# Patient Record
Sex: Female | Born: 1960 | Race: White | Hispanic: No | Marital: Married | Smoking: Never smoker
Health system: Southern US, Community
[De-identification: ages and names within clinical notes are randomized; demographics above are authoritative.]

## PROBLEM LIST (undated history)

## (undated) HISTORY — PX: ABLATION: SHX5711

---

## 2002-05-14 HISTORY — PX: AUGMENTATION MAMMAPLASTY: SUR837

## 2011-12-07 DIAGNOSIS — K635 Polyp of colon: Secondary | ICD-10-CM | POA: Insufficient documentation

## 2011-12-07 DIAGNOSIS — G4700 Insomnia, unspecified: Secondary | ICD-10-CM | POA: Insufficient documentation

## 2015-02-09 DIAGNOSIS — N200 Calculus of kidney: Secondary | ICD-10-CM | POA: Insufficient documentation

## 2015-02-09 DIAGNOSIS — Z87898 Personal history of other specified conditions: Secondary | ICD-10-CM | POA: Insufficient documentation

## 2015-02-09 DIAGNOSIS — Z8619 Personal history of other infectious and parasitic diseases: Secondary | ICD-10-CM | POA: Insufficient documentation

## 2015-02-09 DIAGNOSIS — I493 Ventricular premature depolarization: Secondary | ICD-10-CM | POA: Insufficient documentation

## 2015-02-09 DIAGNOSIS — H6993 Unspecified Eustachian tube disorder, bilateral: Secondary | ICD-10-CM | POA: Insufficient documentation

## 2015-05-03 DIAGNOSIS — K5904 Chronic idiopathic constipation: Secondary | ICD-10-CM | POA: Insufficient documentation

## 2015-11-16 DIAGNOSIS — E559 Vitamin D deficiency, unspecified: Secondary | ICD-10-CM | POA: Insufficient documentation

## 2019-03-31 DIAGNOSIS — Z87440 Personal history of urinary (tract) infections: Secondary | ICD-10-CM | POA: Insufficient documentation

## 2019-04-03 ENCOUNTER — Other Ambulatory Visit: Payer: Self-pay

## 2019-04-03 ENCOUNTER — Ambulatory Visit
Admission: EM | Admit: 2019-04-03 | Discharge: 2019-04-03 | Disposition: A | Discharge status: Home / Self Care | Payer: BC Managed Care – PPO | Attending: Emergency Medicine | Admitting: Emergency Medicine

## 2019-04-03 DIAGNOSIS — R5383 Other fatigue: Secondary | ICD-10-CM

## 2019-04-03 DIAGNOSIS — J302 Other seasonal allergic rhinitis: Secondary | ICD-10-CM

## 2019-04-03 LAB — SARS CORONAVIRUS 2 AG (30 MIN TAT): SARS Coronavirus 2 Ag: NEGATIVE

## 2019-04-03 NOTE — ED Provider Notes (Signed)
[facility]    CSN: [id number] Arrival date & time: 04/03/19  1435      History   Chief Complaint Chief Complaint  Patient presents with  . Fatigue    HPI Audrey Robertson is a 58 y.o. female.   HPI  58 year old female presents with sinus pressure and pain in the maxillary area with fatigue that she has had for 1 day.  States that she does not feel well overall.  She has had no fever or chills.  He denies any cough or shortness of breath.  History of seasonal allergies for which she uses Flonase and Zyrtec.  Main concern is that she is caring for 35 year old mother and wants to protect her.        History reviewed. No pertinent past medical history.  There are no active problems to display for this patient.   Past Surgical History:  Procedure Laterality Date  . ABLATION      OB History   No obstetric history on file.      Home Medications    Prior to Admission medications   Medication Sig Start Date End Date Taking? Authorizing Provider  buPROPion (WELLBUTRIN XL) 150 MG 24 hr tablet Take 150 mg by mouth daily. 03/02/19  Yes [provider]  estradiol (ESTRACE) 0.1 MG/GM vaginal cream  12/16/18  Yes [provider]  fluticasone (FLONASE) 50 MCG/ACT nasal spray Place into both nostrils daily.   Yes [provider]  omeprazole (PRILOSEC) 40 MG capsule Take 40 mg by mouth daily. 03/31/19  Yes [provider]  trimethoprim (TRIMPEX) 100 MG tablet Take 100 mg by mouth daily as needed. 03/11/19  Yes [provider]    Family History History reviewed. No pertinent family history.  Social History Social History   Tobacco Use  . Smoking status: Never Smoker  . Smokeless tobacco: Never Used  Substance Use Topics  . Alcohol use: Yes    Comment: wine occasionally  . Drug use: Not Currently     Allergies   Doxycycline and Macrodantin [nitrofurantoin macrocrystal]   Review of Systems Review of  Systems  Constitutional: Positive for activity change and fatigue. Negative for appetite change, chills and fever.  HENT: Positive for congestion, sinus pressure and sinus pain.   Respiratory: Negative for cough.   All other systems reviewed and are negative.    Physical Exam Triage Vital Signs ED Triage Vitals  Enc Vitals Group     BP 04/03/19 1508 127/73     Pulse Rate 04/03/19 1508 77     Resp 04/03/19 1508 16     Temp 04/03/19 1508 97.9 F (36.6 C)     Temp Source 04/03/19 1508 Oral     SpO2 04/03/19 1508 100 %     Weight 04/03/19 1503 134 lb (60.8 kg)     Height 04/03/19 1503 5\' 5"  (1.651 m)     Head Circumference --      Peak Flow --      Pain Score 04/03/19 1503 0     Pain Loc --      Pain Edu? --      Excl. in [place]? --    No data found.  Updated Vital Signs BP 127/73 (BP Location: Right Arm)   Pulse 77   Temp 97.9 F (36.6 C) (Oral)   Resp 16   Ht 5\' 5"  (1.651 m)   Wt 134 lb (60.8 kg)   SpO2 100%   BMI 22.30  kg/m   Visual Acuity Right Eye Distance:   Left Eye Distance:   Bilateral Distance:    Right Eye Near:   Left Eye Near:    Bilateral Near:     Physical Exam Vitals signs and nursing note reviewed.  Constitutional:      General: She is not in acute distress.    Appearance: Normal appearance. She is normal weight. She is not ill-appearing, toxic-appearing or diaphoretic.  HENT:     Head: Normocephalic and atraumatic.     Right Ear: Tympanic membrane, ear canal and external ear normal.     Left Ear: Tympanic membrane, ear canal and external ear normal.     Nose: Nose normal.     Mouth/Throat:     Mouth: Mucous membranes are moist.     Pharynx: Oropharynx is clear. No oropharyngeal exudate or posterior oropharyngeal erythema.  Eyes:     Conjunctiva/sclera: Conjunctivae normal.  Neck:     Musculoskeletal: Normal range of motion and neck supple.  Cardiovascular:     Rate and Rhythm: Normal rate and regular rhythm.     Pulses: Normal pulses.      Heart sounds: Normal heart sounds.  Pulmonary:     Breath sounds: Normal breath sounds.  Musculoskeletal: Normal range of motion.  Skin:    General: Skin is warm and dry.  Neurological:     General: No focal deficit present.     Mental Status: She is alert and oriented to person, place, and time.  Psychiatric:        Mood and Affect: Mood normal.        Behavior: Behavior normal.        Thought Content: Thought content normal.        Judgment: Judgment normal.      UC Treatments / Results  Labs (all labs ordered are listed, but only abnormal results are displayed) Labs Reviewed  SARS CORONAVIRUS 2 AG (30 MIN TAT)    EKG   Radiology No results found.  Procedures Procedures (including critical care time)  Medications Ordered in UC Medications - No data to display  Initial Impression / Assessment and Plan / UC Course  I have reviewed the triage vital signs and the nursing notes.  Pertinent labs & imaging results that were available during my care of the patient were reviewed by me and considered in my medical decision making (see chart for details).   58 year old female presents with sinus pressure and pain and fatigue that she has had for 1 day.  Antigen Covid testing was negative today.  Because of her symptomatic presentation we will also test PCR.  She will quarantine until the results of the Covid testing are available.  She will continue using her Flonase and Zyrtec.   Final Clinical Impressions(s) / UC Diagnoses   Final diagnoses:  Fatigue, unspecified type  Seasonal allergies     Discharge Instructions     Quarantine until after the results of the Covid testing are available    ED Prescriptions    None     PDMP not reviewed this encounter.   Lutricia Feil, PA-C 04/03/19 1601

## 2019-04-03 NOTE — Discharge Instructions (Signed)
Quarantine until after the results of the Covid testing are available

## 2019-04-03 NOTE — ED Triage Notes (Addendum)
Patient states that she has been having sinus pressure and pain along with fatigue x yesterday. Patient states that she overall just doesn't feel well and would like to be swabbed.

## 2019-04-04 LAB — NOVEL CORONAVIRUS, NAA (HOSP ORDER, SEND-OUT TO REF LAB; TAT 18-24 HRS): SARS-CoV-2, NAA: NOT DETECTED

## 2019-05-16 DIAGNOSIS — Z8616 Personal history of COVID-19: Secondary | ICD-10-CM | POA: Insufficient documentation

## 2019-05-17 ENCOUNTER — Other Ambulatory Visit: Payer: Self-pay

## 2019-05-17 ENCOUNTER — Ambulatory Visit
Admission: EM | Admit: 2019-05-17 | Discharge: 2019-05-17 | Disposition: A | Discharge status: Home / Self Care | Payer: BC Managed Care – PPO | Attending: Family Medicine | Admitting: Family Medicine

## 2019-05-17 ENCOUNTER — Encounter: Payer: Self-pay | Admitting: Emergency Medicine

## 2019-05-17 DIAGNOSIS — R05 Cough: Secondary | ICD-10-CM | POA: Insufficient documentation

## 2019-05-17 DIAGNOSIS — Z20822 Contact with and (suspected) exposure to covid-19: Secondary | ICD-10-CM | POA: Insufficient documentation

## 2019-05-17 DIAGNOSIS — B9789 Other viral agents as the cause of diseases classified elsewhere: Secondary | ICD-10-CM | POA: Insufficient documentation

## 2019-05-17 DIAGNOSIS — R059 Cough, unspecified: Secondary | ICD-10-CM

## 2019-05-17 DIAGNOSIS — Z880 Allergy status to penicillin: Secondary | ICD-10-CM | POA: Insufficient documentation

## 2019-05-17 DIAGNOSIS — J988 Other specified respiratory disorders: Secondary | ICD-10-CM

## 2019-05-17 DIAGNOSIS — Z881 Allergy status to other antibiotic agents status: Secondary | ICD-10-CM | POA: Diagnosis not present

## 2019-05-17 DIAGNOSIS — Z79899 Other long term (current) drug therapy: Secondary | ICD-10-CM | POA: Insufficient documentation

## 2019-05-17 LAB — INFLUENZA PANEL BY PCR (TYPE A & B)
Influenza A By PCR: NEGATIVE
Influenza B By PCR: NEGATIVE

## 2019-05-17 NOTE — ED Triage Notes (Signed)
Patient c/o cough, chest congestion and bodyaches that started 2 days ago.  Patient denies fevers.

## 2019-05-17 NOTE — Discharge Instructions (Addendum)
Recommend continue Tylenol as needed for body aches. May continue Mucinex DM as needed for cough. Continue to push fluids to help loosen up mucus in chest. Rest. Stay at home. Follow-up pending COVID 19 test results.

## 2019-05-18 NOTE — ED Provider Notes (Signed)
[facility]    CSN: [id number] Arrival date & time: 05/17/19  1306      History   Chief Complaint Chief Complaint  Patient presents with  . Cough  . Generalized Body Aches    HPI Audrey Robertson is a 59 y.o. female.   59 year old female presents with body aches, cough and chest congestion that started yesterday. Minimal nasal congestion. Denies any fever or GI symptoms. Husband also sick with similar symptoms. Has taken Mucinex DM, Tylenol and Zicam with some relief. Concerned over possibility of influenza. Other chronic health issues include GERD, recurrent UTI's and environmental allergies and currently takes Wellbutrin, Prilosec, Vit D and Flonase daily. Uses Estrace vaginal cream and Trimethoprim prn.   The history is provided by the patient.    History reviewed. No pertinent past medical history.  There are no problems to display for this patient.   Past Surgical History:  Procedure Laterality Date  . ABLATION      OB History   No obstetric history on file.      Home Medications    Prior to Admission medications   Medication Sig Start Date End Date Taking? Authorizing Provider  buPROPion (WELLBUTRIN XL) 150 MG 24 hr tablet Take 150 mg by mouth daily. 03/02/19  Yes [provider]  estradiol (ESTRACE) 0.1 MG/GM vaginal cream  12/16/18  Yes [provider]  fluticasone (FLONASE) 50 MCG/ACT nasal spray Place into both nostrils daily.   Yes [provider]  omeprazole (PRILOSEC) 40 MG capsule Take 40 mg by mouth daily. 03/31/19  Yes [provider]  trimethoprim (TRIMPEX) 100 MG tablet Take 100 mg by mouth daily as needed. 03/11/19  Yes [provider]    Family History History reviewed. No pertinent family history.  Social History Social History   Tobacco Use  . Smoking status: Never Smoker  . Smokeless tobacco: Never Used  Substance Use Topics  . Alcohol use: Yes    Comment: wine occasionally    . Drug use: Not Currently     Allergies   Erythromycin, Doxycycline, Macrodantin [nitrofurantoin macrocrystal], Penicillins, and Amoxicillin-pot clavulanate   Review of Systems Review of Systems  Constitutional: Positive for fatigue. Negative for activity change, appetite change, chills and fever.  HENT: Positive for congestion (chest), postnasal drip and rhinorrhea. Negative for ear discharge, ear pain, facial swelling, mouth sores, nosebleeds, sinus pressure, sinus pain, sneezing, sore throat and trouble swallowing.   Eyes: Negative for pain, discharge, redness and itching.  Respiratory: Positive for cough. Negative for chest tightness, shortness of breath and wheezing.   Gastrointestinal: Negative for abdominal pain, diarrhea, nausea and vomiting.  Musculoskeletal: Positive for arthralgias and myalgias. Negative for back pain, neck pain and neck stiffness.  Skin: Negative for color change, rash and wound.  Allergic/Immunologic: Positive for environmental allergies. Negative for immunocompromised state.  Neurological: Negative for dizziness, tremors, seizures, syncope, weakness, light-headedness, numbness and headaches.  Hematological: Negative for adenopathy. Does not bruise/bleed easily.     Physical Exam Triage Vital Signs ED Triage Vitals  Enc Vitals Group     BP 05/17/19 1343 138/73     Pulse Rate 05/17/19 1343 65     Resp 05/17/19 1343 14     Temp 05/17/19 1343 97.8 F (36.6 C)     Temp Source 05/17/19 1343 Oral     SpO2 05/17/19 1343 99 %     Weight 05/17/19 1339 134 lb (60.8 kg)     Height 05/17/19  1339 5\' 5"  (1.651 m)     Head Circumference --      Peak Flow --      Pain Score 05/17/19 1339 0     Pain Loc --      Pain Edu? --      Excl. in [place]? --    No data found.  Updated Vital Signs BP 138/73 (BP Location: Left Arm)   Pulse 65   Temp 97.8 F (36.6 C) (Oral)   Resp 14   Ht 5\' 5"  (1.651 m)   Wt 134 lb (60.8 kg)   SpO2 99%   BMI 22.30 kg/m   Visual  Acuity Right Eye Distance:   Left Eye Distance:   Bilateral Distance:    Right Eye Near:   Left Eye Near:    Bilateral Near:     Physical Exam Vitals and nursing note reviewed.  Constitutional:      General: She is awake. She is not in acute distress.    Appearance: She is well-developed and well-groomed.     Comments: Patient sitting comfortably on exam table in no acute distress.   HENT:     Head: Normocephalic and atraumatic.     Right Ear: Hearing, tympanic membrane, ear canal and external ear normal.     Left Ear: Hearing, tympanic membrane, ear canal and external ear normal.     Nose: Rhinorrhea present. No mucosal edema. Rhinorrhea is clear.     Right Sinus: No maxillary sinus tenderness or frontal sinus tenderness.     Left Sinus: No maxillary sinus tenderness or frontal sinus tenderness.     Mouth/Throat:     Lips: Pink.     Mouth: Mucous membranes are moist.     Pharynx: Uvula midline. Posterior oropharyngeal erythema present. No pharyngeal swelling, oropharyngeal exudate or uvula swelling.  Eyes:     Extraocular Movements: Extraocular movements intact.     Conjunctiva/sclera: Conjunctivae normal.  Cardiovascular:     Rate and Rhythm: Normal rate and regular rhythm.     Heart sounds: Normal heart sounds. No murmur.  Pulmonary:     Effort: Pulmonary effort is normal. No respiratory distress.     Breath sounds: Normal breath sounds and air entry. No decreased air movement. No decreased breath sounds, wheezing, rhonchi or rales.  Musculoskeletal:        General: Normal range of motion.     Cervical back: Normal range of motion and neck supple. No rigidity.  Lymphadenopathy:     Cervical: No cervical adenopathy.  Skin:    General: Skin is warm and dry.     Capillary Refill: Capillary refill takes less than 2 seconds.     Findings: No rash.  Neurological:     General: No focal deficit present.     Mental Status: She is alert and oriented to person, place, and time.    Psychiatric:        Mood and Affect: Mood normal.        Behavior: Behavior normal. Behavior is cooperative.        Thought Content: Thought content normal.        Judgment: Judgment normal.      UC Treatments / Results  Labs (all labs ordered are listed, but only abnormal results are displayed) Labs Reviewed  NOVEL CORONAVIRUS, NAA (HOSP ORDER, SEND-OUT TO REF LAB; TAT 18-24 HRS)  INFLUENZA PANEL BY PCR (TYPE A & B)    EKG   Radiology No results found.  Procedures Procedures (including critical care time)  Medications Ordered in UC Medications - No data to display  Initial Impression / Assessment and Plan / UC Course  I have reviewed the triage vital signs and the nursing notes.  Pertinent labs & imaging results that were available during my care of the patient were reviewed by me and considered in my medical decision making (see chart for details).    Reviewed negative influenza test results. Discussed that she probably has a mild viral illness- can not rule out COVID 19. Recommend continue Mucinex DM as directed. May continue Tylenol as needed for body aches. Increase fluids to help loosen up any mucus in her chest. Rest. Stay at home. Follow-up pending COVID 19 test results.   Final Clinical Impressions(s) / UC Diagnoses   Final diagnoses:  Viral respiratory illness  Cough     Discharge Instructions     Recommend continue Tylenol as needed for body aches. May continue Mucinex DM as needed for cough. Continue to push fluids to help loosen up mucus in chest. Rest. Stay at home. Follow-up pending COVID 19 test results.     ED Prescriptions    None     PDMP not reviewed this encounter.   Katy Apo, NP 05/18/19 1005

## 2019-05-19 LAB — NOVEL CORONAVIRUS, NAA (HOSP ORDER, SEND-OUT TO REF LAB; TAT 18-24 HRS): SARS-CoV-2, NAA: NOT DETECTED

## 2019-12-11 DIAGNOSIS — R438 Other disturbances of smell and taste: Secondary | ICD-10-CM | POA: Insufficient documentation

## 2020-02-29 ENCOUNTER — Other Ambulatory Visit: Payer: Self-pay | Admitting: Pediatrics

## 2020-02-29 DIAGNOSIS — Z1231 Encounter for screening mammogram for malignant neoplasm of breast: Secondary | ICD-10-CM

## 2020-03-07 ENCOUNTER — Other Ambulatory Visit: Payer: Self-pay

## 2020-03-07 ENCOUNTER — Other Ambulatory Visit: Payer: Self-pay | Admitting: Pediatrics

## 2020-03-07 ENCOUNTER — Ambulatory Visit
Admission: RE | Admit: 2020-03-07 | Discharge: 2020-03-07 | Disposition: A | Discharge status: Home / Self Care | Payer: BC Managed Care – PPO | Source: Ambulatory Visit | Attending: Pediatrics | Admitting: Pediatrics

## 2020-03-07 DIAGNOSIS — Z1231 Encounter for screening mammogram for malignant neoplasm of breast: Secondary | ICD-10-CM

## 2020-03-09 ENCOUNTER — Inpatient Hospital Stay
Admission: RE | Admit: 2020-03-09 | Discharge: 2020-03-09 | Disposition: A | Discharge status: Home / Self Care | Payer: Self-pay | Source: Ambulatory Visit | Attending: *Deleted | Admitting: *Deleted

## 2020-03-09 ENCOUNTER — Other Ambulatory Visit: Payer: Self-pay | Admitting: *Deleted

## 2020-03-09 DIAGNOSIS — Z1231 Encounter for screening mammogram for malignant neoplasm of breast: Secondary | ICD-10-CM

## 2020-03-11 ENCOUNTER — Other Ambulatory Visit: Payer: Self-pay | Admitting: Pediatrics

## 2020-03-11 DIAGNOSIS — N6489 Other specified disorders of breast: Secondary | ICD-10-CM

## 2020-03-11 DIAGNOSIS — R928 Other abnormal and inconclusive findings on diagnostic imaging of breast: Secondary | ICD-10-CM

## 2020-03-25 ENCOUNTER — Ambulatory Visit
Admission: RE | Admit: 2020-03-25 | Discharge: 2020-03-25 | Disposition: A | Discharge status: Home / Self Care | Payer: BC Managed Care – PPO | Source: Ambulatory Visit | Attending: Pediatrics | Admitting: Pediatrics

## 2020-03-25 ENCOUNTER — Other Ambulatory Visit: Payer: Self-pay

## 2020-03-25 DIAGNOSIS — R928 Other abnormal and inconclusive findings on diagnostic imaging of breast: Secondary | ICD-10-CM | POA: Diagnosis not present

## 2020-03-25 DIAGNOSIS — N6489 Other specified disorders of breast: Secondary | ICD-10-CM

## 2020-11-11 DIAGNOSIS — M65311 Trigger thumb, right thumb: Secondary | ICD-10-CM | POA: Insufficient documentation

## 2021-02-21 DIAGNOSIS — Z8669 Personal history of other diseases of the nervous system and sense organs: Secondary | ICD-10-CM | POA: Insufficient documentation

## 2021-05-07 IMAGING — MG DIGITAL SCREENING BREAST BILAT IMPLANT W/ TOMO W/ CAD
9 of 16 series · 9 of 32 positions shown · non-contrast
Comparison: Previous exam(s).

CLINICAL DATA: Screening.

EXAM:
DIGITAL SCREENING BILATERAL MAMMOGRAM WITH IMPLANTS, CAD AND TOMO
The patient has prepectoral silicone implants. Standard and implant
displaced views were performed.

[R MLO (1 of 2)]
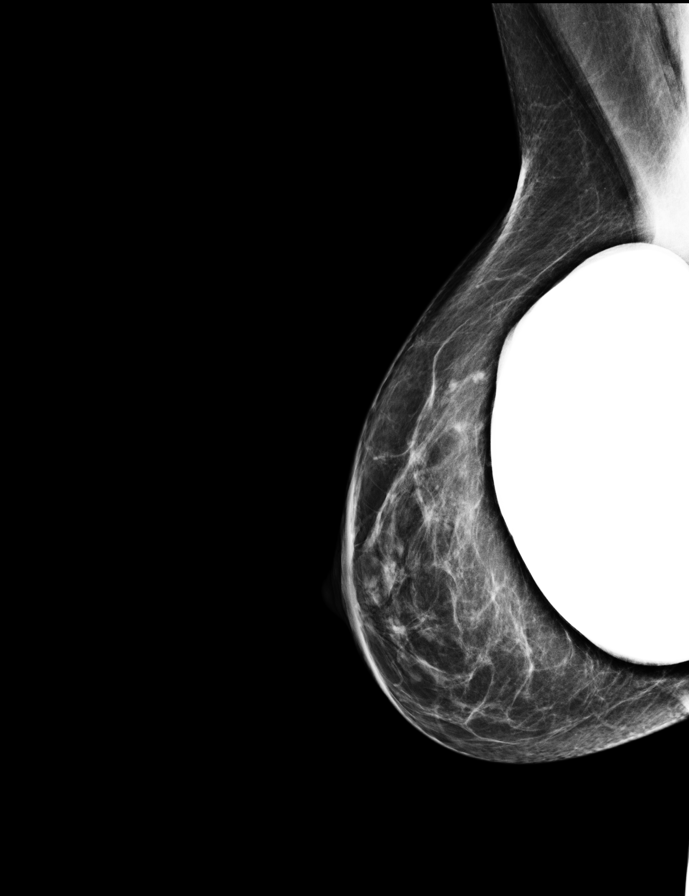

[L MLO (1 of 2)]
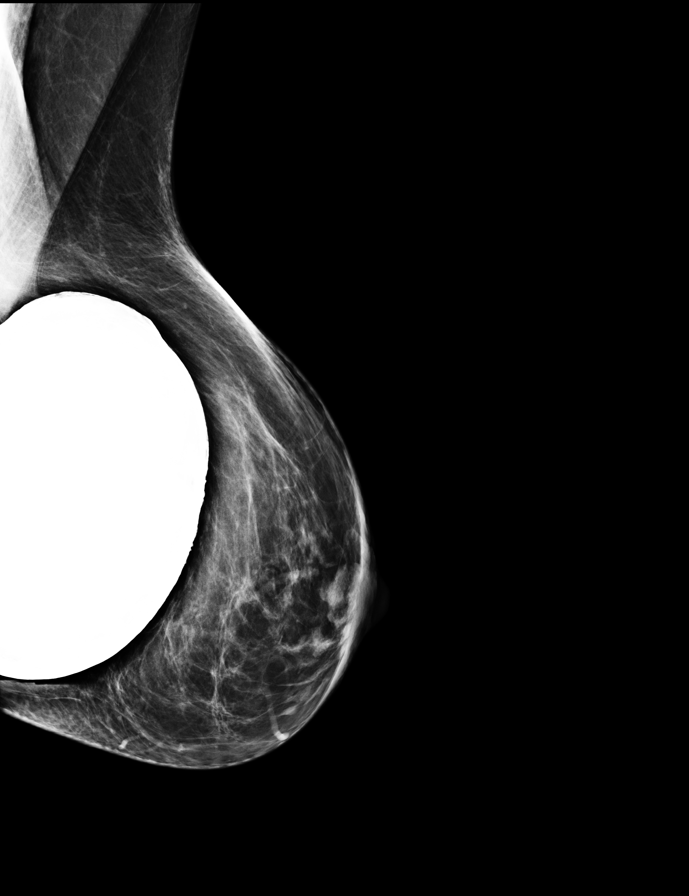

[L CC]
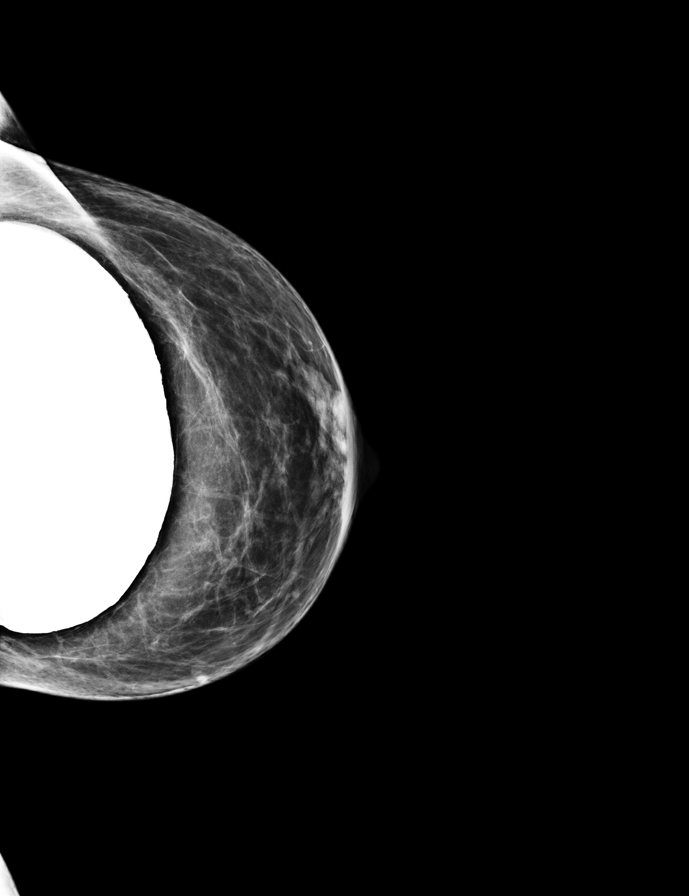

[R CC]
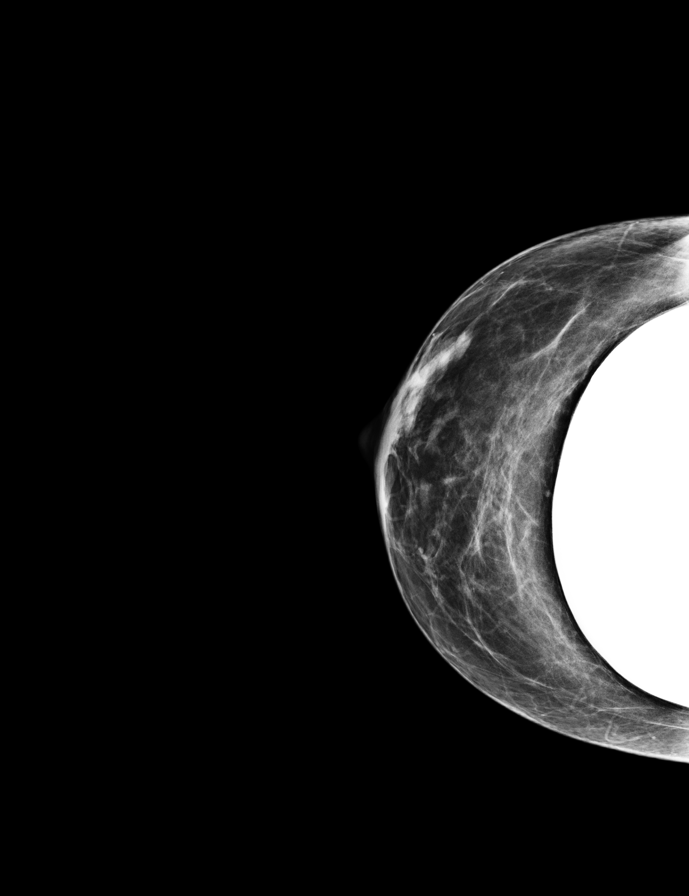

[R CC synth-2D]
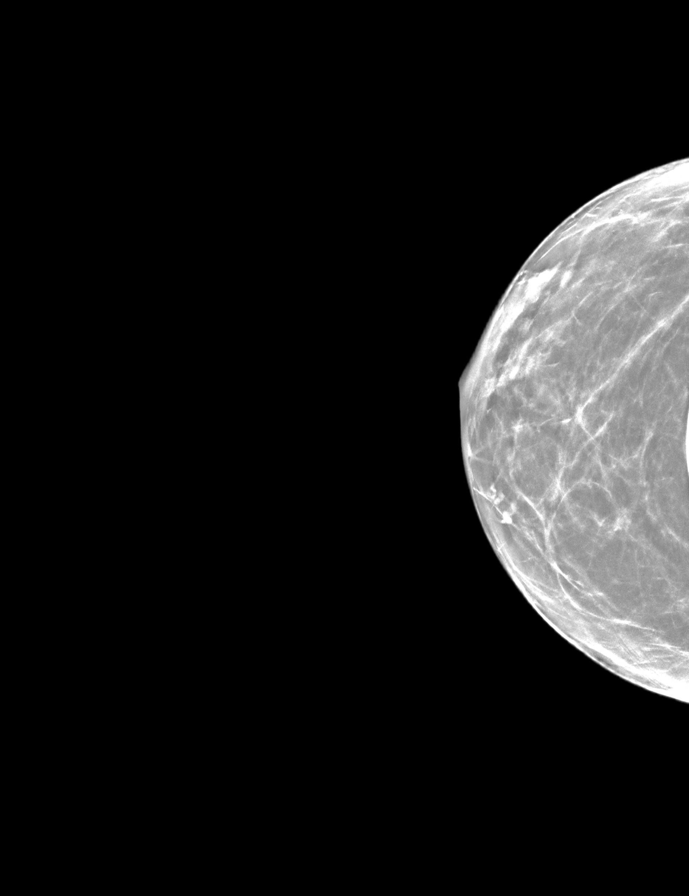

[R MLO synth-2D]
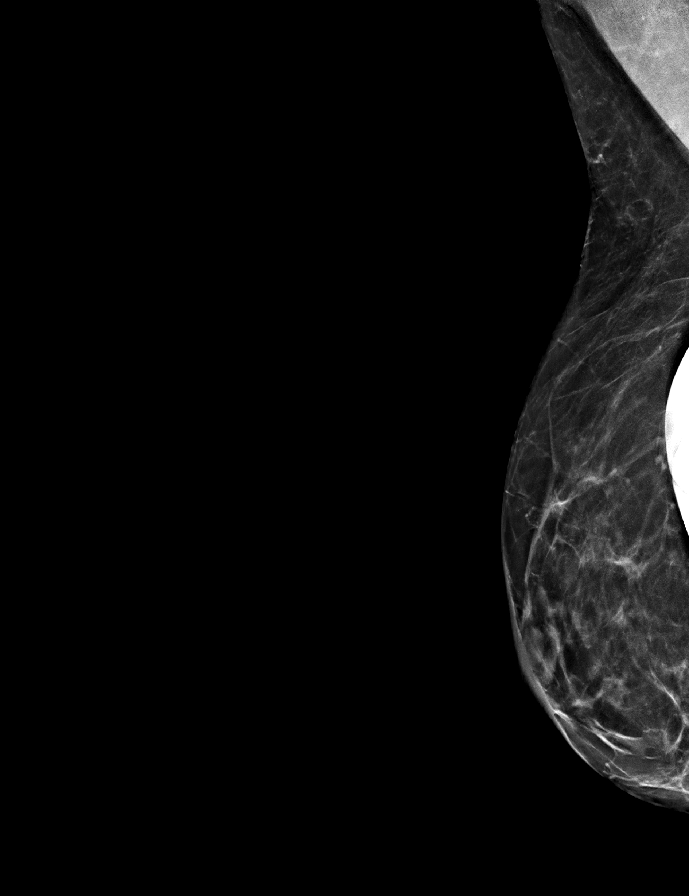

[L MLO synth-2D]
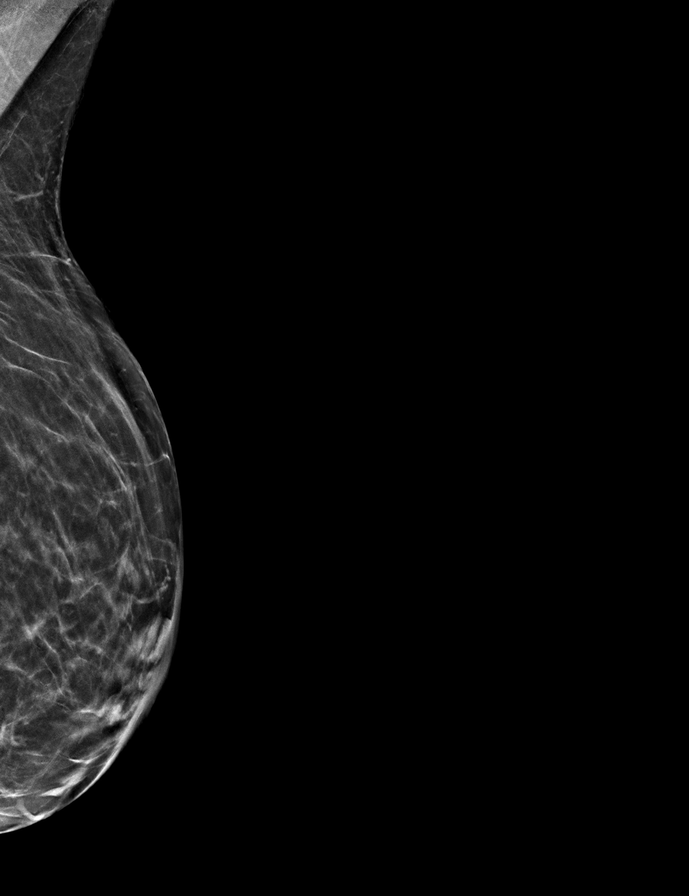

[R MLO (2 of 2)]
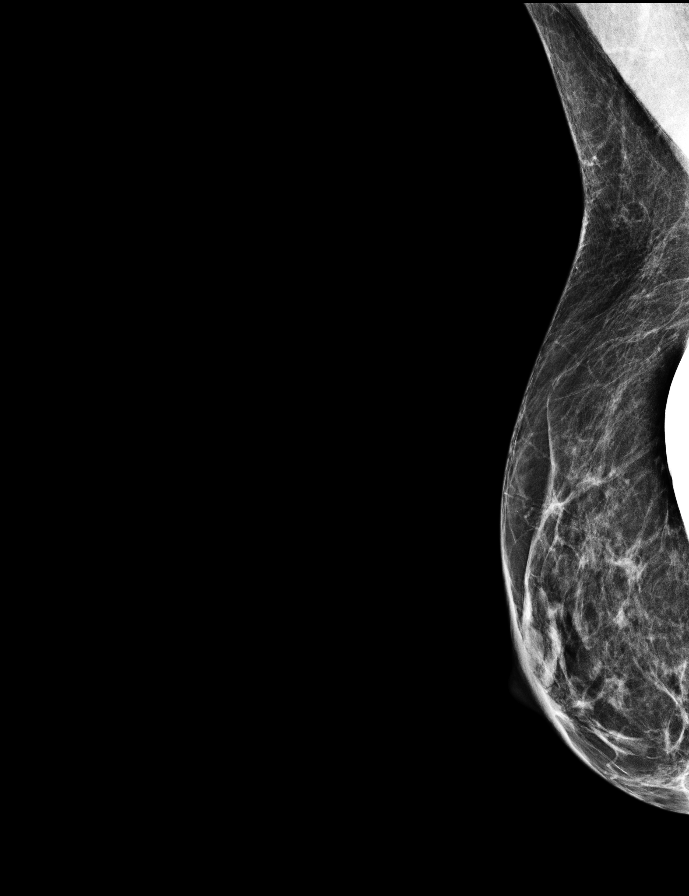

[L MLO (2 of 2)]
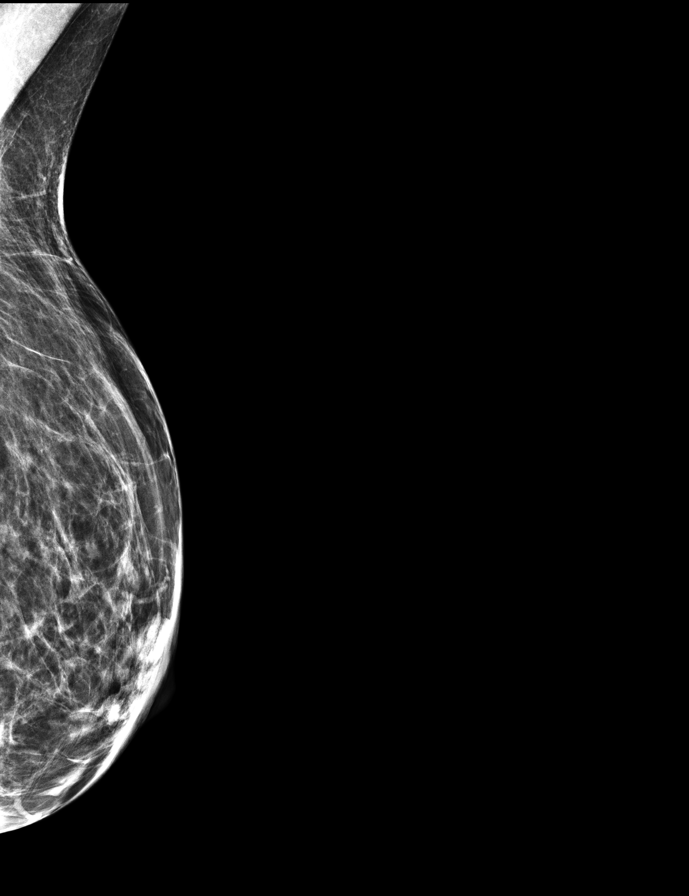

[9 of 32 positions shown; findings below may reference images not displayed]

ACR Breast Density Category b: There are scattered areas of
fibroglandular density.
FINDINGS: In the right breast, a possible focal asymmetry warrants further
evaluation. In the left breast, no suspicious masses or malignant
type calcifications are identified. Images were processed with CAD.
IMPRESSION: Further evaluation is suggested for possible focal asymmetry in the
right breast.

RECOMMENDATION:
Diagnostic mammogram and possibly ultrasound of the right breast.
(Code:[id number]-[id number])

The patient will be contacted regarding the findings, and additional
imaging will be scheduled.

BI-RADS CATEGORY  0: Incomplete. Need additional imaging evaluation
and/or prior mammograms for comparison.

## 2022-02-15 ENCOUNTER — Other Ambulatory Visit: Payer: Self-pay | Admitting: Otolaryngology

## 2022-02-15 DIAGNOSIS — H9042 Sensorineural hearing loss, unilateral, left ear, with unrestricted hearing on the contralateral side: Secondary | ICD-10-CM

## 2022-02-15 DIAGNOSIS — H9312 Tinnitus, left ear: Secondary | ICD-10-CM

## 2022-02-22 ENCOUNTER — Ambulatory Visit: Payer: BC Managed Care – PPO | Admitting: Dermatology

## 2022-02-22 DIAGNOSIS — L820 Inflamed seborrheic keratosis: Secondary | ICD-10-CM | POA: Diagnosis not present

## 2022-02-22 DIAGNOSIS — L578 Other skin changes due to chronic exposure to nonionizing radiation: Secondary | ICD-10-CM | POA: Diagnosis not present

## 2022-02-22 DIAGNOSIS — L821 Other seborrheic keratosis: Secondary | ICD-10-CM

## 2022-02-22 DIAGNOSIS — L309 Dermatitis, unspecified: Secondary | ICD-10-CM

## 2022-02-22 MED ORDER — PIMECROLIMUS 1 % EX CREA: TOPICAL_CREAM | Freq: Two times a day (BID) | CUTANEOUS | 0 refills | Status: DC

## 2022-02-22 NOTE — Progress Notes (Deleted)
   New Patient Visit  Subjective  Audrey Robertson is a 61 y.o. female who presents for the following: Lip dermatitis (Has been occurring for years - currently using Pimecrolimus which helps some. Patient has noticed a relation to lip liners, lipsticks, and glosses and the dermatitis). The patient has spots, moles and lesions to be evaluated, some may be new or changing.    The following portions of the chart were reviewed this encounter and updated as appropriate:       Review of Systems:  No other skin or systemic complaints except as noted in HPI or Assessment and Plan.  Objective  Well appearing patient in no apparent distress; mood and affect are within normal limits.  A focused examination was performed including the face, and trunk. Relevant physical exam findings are noted in the Assessment and Plan.  Lips Fine peeling of the vermilion border and some on the outside of the vermilion border.  Face x 7 (7) Stuck-on, waxy, tan-brown papule or plaque --Discussed benign etiology and prognosis.   Left Flank x Erythematous stuck-on, waxy papule or plaque    Assessment & Plan  Dermatitis of lip Lips  Pt states related to cosmetic products -   Discussed may be related to Retinoid use.  Discussed and recommend patch testing. Discussed open patch testing to lip liners, glosses, and lip sticks.  Continue Pimecrolimus 0.1% cream BID PRN. Consider Tacrolimus ointment in the future.  Seborrheic keratosis (7) Face x 7  Discussed with patient that if lesions aren't bothersome treatment is not covered by insurance and the out of pocket fee is $65 to treat the first lesion and $15 for each one thereafter.   Destruction of lesion - Face x 7 Complexity: simple   Destruction method: cryotherapy   Informed consent: discussed and consent obtained   Timeout:  patient name, date of birth, surgical site, and procedure verified Lesion destroyed using liquid nitrogen: Yes   Region  frozen until ice ball extended beyond lesion: Yes   Outcome: patient tolerated procedure well with no complications   Post-procedure details: wound care instructions given    Inflamed seborrheic keratosis Left Flank x  Symptomatic, irritating, patient would like treated.   Destruction of lesion - Left Flank x Complexity: simple   Destruction method: cryotherapy   Informed consent: discussed and consent obtained   Timeout:  patient name, date of birth, surgical site, and procedure verified Lesion destroyed using liquid nitrogen: Yes   Region frozen until ice ball extended beyond lesion: Yes   Outcome: patient tolerated procedure well with no complications   Post-procedure details: wound care instructions given     Return for patch testing on a Monday or Tuesday.  Luther Redo, CMA, am acting as scribe for Sarina Ser, MD .

## 2022-02-22 NOTE — Patient Instructions (Signed)
Due to recent changes in healthcare laws, you may see results of your pathology and/or laboratory studies on MyChart before the doctors have had a chance to review them. We understand that in some cases there may be results that are confusing or concerning to you. Please understand that not all results are received at the same time and often the doctors may need to interpret multiple results in order to provide you with the best plan of care or course of treatment. Therefore, we ask that you please give us 2 business days to thoroughly review all your results before contacting the office for clarification. Should we see a critical lab result, you will be contacted sooner.   If You Need Anything After Your Visit  If you have any questions or concerns for your doctor, please call our main line at [phone] and press option 4 to reach your doctor's medical assistant. If no one answers, please leave a voicemail as directed and we will return your call as soon as possible. Messages left after 4 pm will be answered the following business day.   You may also send us a message via MyChart. We typically respond to MyChart messages within 1-2 business days.  For prescription refills, please ask your pharmacy to contact our office. Our fax number is [phone].  If you have an urgent issue when the clinic is closed that cannot wait until the next business day, you can page your doctor at the number below.    Please note that while we do our best to be available for urgent issues outside of office hours, we are not available 24/7.   If you have an urgent issue and are unable to reach us, you may choose to seek medical care at your doctor's office, retail clinic, urgent care center, or emergency room.  If you have a medical emergency, please immediately call 911 or go to the emergency department.  Pager Numbers  - Dr. Kowalski: [phone]  - Dr. Moye: [phone]  - Dr. Stewart:  [phone]  In the event of inclement weather, please call our main line at [phone] for an update on the status of any delays or closures.  Dermatology Medication Tips: Please keep the boxes that topical medications come in in order to help keep track of the instructions about where and how to use these. Pharmacies typically print the medication instructions only on the boxes and not directly on the medication tubes.   If your medication is too expensive, please contact our office at [phone] option 4 or send us a message through MyChart.   We are unable to tell what your co-pay for medications will be in advance as this is different depending on your insurance coverage. However, we may be able to find a substitute medication at lower cost or fill out paperwork to get insurance to cover a needed medication.   If a prior authorization is required to get your medication covered by your insurance company, please allow us 1-2 business days to complete this process.  Drug prices often vary depending on where the prescription is filled and some pharmacies may offer cheaper prices.  The website www.goodrx.com contains coupons for medications through different pharmacies. The prices here do not account for what the cost may be with help from insurance (it may be cheaper with your insurance), but the website can give you the price if you did not use any insurance.  - You can print the associated coupon and take it with   your prescription to the pharmacy.  - You may also stop by our office during regular business hours and pick up a GoodRx coupon card.  - If you need your prescription sent electronically to a different pharmacy, notify our office through [facility] MyChart or by phone at [phone] option 4.     Si Usted Necesita Algo Despus de Su Visita  Tambin puede enviarnos un mensaje a travs de MyChart. Por lo general respondemos a los mensajes de MyChart en el transcurso de 1 a 2  das hbiles.  Para renovar recetas, por favor pida a su farmacia que se ponga en contacto con nuestra oficina. Nuestro nmero de fax es el [phone].  Si tiene un asunto urgente cuando la clnica est cerrada y que no puede esperar hasta el siguiente da hbil, puede llamar/localizar a su doctor(a) al nmero que aparece a continuacin.   Por favor, tenga en cuenta que aunque hacemos todo lo posible para estar disponibles para asuntos urgentes fuera del horario de oficina, no estamos disponibles las 24 horas del da, los 7 das de la semana.   Si tiene un problema urgente y no puede comunicarse con nosotros, puede optar por buscar atencin mdica  en el consultorio de su doctor(a), en una clnica privada, en un centro de atencin urgente o en una sala de emergencias.  Si tiene una emergencia mdica, por favor llame inmediatamente al 911 o vaya a la sala de emergencias.  Nmeros de bper  - Dr. Kowalski: [phone]  - Dra. Moye: [phone]  - Dra. Stewart: [phone]  En caso de inclemencias del tiempo, por favor llame a nuestra lnea principal al [phone] para una actualizacin sobre el estado de cualquier retraso o cierre.  Consejos para la medicacin en dermatologa: Por favor, guarde las cajas en las que vienen los medicamentos de uso tpico para ayudarle a seguir las instrucciones sobre dnde y cmo usarlos. Las farmacias generalmente imprimen las instrucciones del medicamento slo en las cajas y no directamente en los tubos del medicamento.   Si su medicamento es muy caro, por favor, pngase en contacto con nuestra oficina llamando al [phone] y presione la opcin 4 o envenos un mensaje a travs de MyChart.   No podemos decirle cul ser su copago por los medicamentos por adelantado ya que esto es diferente dependiendo de la cobertura de su seguro. Sin embargo, es posible que podamos encontrar un medicamento sustituto a menor costo o llenar un formulario para que el  seguro cubra el medicamento que se considera necesario.   Si se requiere una autorizacin previa para que su compaa de seguros cubra su medicamento, por favor permtanos de 1 a 2 das hbiles para completar este proceso.  Los precios de los medicamentos varan con frecuencia dependiendo del lugar de dnde se surte la receta y alguna farmacias pueden ofrecer precios ms baratos.  El sitio web www.goodrx.com tiene cupones para medicamentos de diferentes farmacias. Los precios aqu no tienen en cuenta lo que podra costar con la ayuda del seguro (puede ser ms barato con su seguro), pero el sitio web puede darle el precio si no utiliz ningn seguro.  - Puede imprimir el cupn correspondiente y llevarlo con su receta a la farmacia.  - Tambin puede pasar por nuestra oficina durante el horario de atencin regular y recoger una tarjeta de cupones de GoodRx.  - Si necesita que su receta se enve electrnicamente a una farmacia diferente, informe a nuestra oficina a travs de MyChart de [facility]   o por telfono llamando al [phone] y presione la opcin 4.  

## 2022-02-22 NOTE — Progress Notes (Signed)
Follow-Up Visit   Subjective  Audrey Robertson is a 61 y.o. female who presents for the following: Lip dermatitis (Has been occurring for years - currently using Pimecrolimus which helps some. Patient has noticed a relation to lip liners, lipsticks, and glosses and the dermatitis). The patient has spots, moles and lesions to be evaluated, some may be new or changing.  The following portions of the chart were reviewed this encounter and updated as appropriate:   Tobacco  Allergies  Meds  Problems  Med Hx  Surg Hx  Fam Hx     Review of Systems:  No other skin or systemic complaints except as noted in HPI or Assessment and Plan.  Objective  Well appearing patient in no apparent distress; mood and affect are within normal limits.  A focused examination was performed including the face and trunk. Relevant physical exam findings are noted in the Assessment and Plan.  Lips Fine peeling of the vermilion border and some on the outside of the vermilion border.  Face x 7 (7) Stuck-on, waxy, tan-brown papule or plaque --Discussed benign etiology and prognosis.   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  Discussed and recommend patch testing. Discussed open patch testing to lip liners, glosses, and lip sticks.  Continue Pimecrolimus 0.1% cream BID PRN.  Consider Tacrolimus ointment in the future.  pimecrolimus (ELIDEL) 1 % cream - Lips Apply topically 2 (two) times daily.  Seborrheic keratosis (7) Face x 7 Discussed with patient that if lesions aren't bothersome treatment is not covered by insurance and the out of pocket fee is $65 to treat the first lesion and $15 for each one thereafter.   Destruction of lesion - Face x 7 Complexity: simple   Destruction method: cryotherapy   Informed consent:  discussed and consent obtained   Timeout:  patient name, date of birth, surgical site, and procedure verified Lesion destroyed using liquid nitrogen: Yes   Region frozen until ice ball extended beyond lesion: Yes   Outcome: patient tolerated procedure well with no complications   Post-procedure details: wound care instructions given    Inflamed seborrheic keratosis (2) L side x 1, L braline x 1 Symptomatic, irritating, patient would like treated. Destruction of lesion - L side x 1, L braline x 1 Complexity: simple   Destruction method: cryotherapy   Informed consent: discussed and consent obtained   Timeout:  patient name, date of birth, surgical site, and procedure verified Lesion destroyed using liquid nitrogen: Yes   Region frozen until ice ball extended beyond lesion: Yes   Outcome: patient tolerated procedure well with no complications   Post-procedure details: wound care instructions given    Actinic Damage - chronic, secondary to cumulative UV radiation exposure/sun exposure over time - diffuse scaly erythematous macules with underlying dyspigmentation - Recommend daily broad spectrum sunscreen SPF 30+ to sun-exposed areas, reapply every 2 hours as needed.  - Recommend staying in the shade or wearing long sleeves, sun glasses (UVA+UVB protection) and wide brim hats (4-inch brim around the entire circumference of the hat). - Call for new or changing lesions.  Return for patch testing on a Monday or Tuesday.  Luther Redo, CMA, am acting as scribe for Sarina Ser, MD . Documentation: I have reviewed the above documentation for  accuracy and completeness, and I agree with the above.  Sarina Ser, MD

## 2022-03-03 ENCOUNTER — Encounter: Payer: Self-pay | Admitting: Dermatology

## 2022-03-05 ENCOUNTER — Other Ambulatory Visit: Payer: Self-pay

## 2022-03-05 ENCOUNTER — Encounter: Payer: Self-pay | Admitting: Dermatology

## 2022-03-05 DIAGNOSIS — L309 Dermatitis, unspecified: Secondary | ICD-10-CM

## 2022-03-05 MED ORDER — PIMECROLIMUS 1 % EX CREA: TOPICAL_CREAM | Freq: Two times a day (BID) | CUTANEOUS | 0 refills | Status: AC

## 2022-03-12 ENCOUNTER — Ambulatory Visit: Payer: BC Managed Care – PPO

## 2022-03-13 ENCOUNTER — Ambulatory Visit
Admission: RE | Admit: 2022-03-13 | Discharge: 2022-03-13 | Disposition: A | Discharge status: Home / Self Care | Payer: BC Managed Care – PPO | Source: Ambulatory Visit | Attending: Otolaryngology | Admitting: Otolaryngology

## 2022-03-13 DIAGNOSIS — H9312 Tinnitus, left ear: Secondary | ICD-10-CM

## 2022-03-13 DIAGNOSIS — H9042 Sensorineural hearing loss, unilateral, left ear, with unrestricted hearing on the contralateral side: Secondary | ICD-10-CM

## 2022-03-13 MED ORDER — GADOBENATE DIMEGLUMINE 529 MG/ML IV SOLN
12.0000 mL | Freq: Once | INTRAVENOUS | Status: AC | PRN
  Administered 2022-03-13: 12 mL via INTRAVENOUS

## 2022-03-14 ENCOUNTER — Ambulatory Visit: Payer: BC Managed Care – PPO

## 2022-03-19 ENCOUNTER — Ambulatory Visit (INDEPENDENT_AMBULATORY_CARE_PROVIDER_SITE_OTHER): Payer: BC Managed Care – PPO | Admitting: Dermatology

## 2022-03-19 ENCOUNTER — Ambulatory Visit: Payer: BC Managed Care – PPO

## 2022-03-19 DIAGNOSIS — L309 Dermatitis, unspecified: Secondary | ICD-10-CM | POA: Diagnosis not present

## 2022-03-19 NOTE — Progress Notes (Signed)
Patch testing was performed on 03/19/2022 using standard technique. True test x 36 applied to back at visit today. Pt advised to keep panels dry until removal in 2 days for first read.    Johnsie Kindred, RMA Documentation: I have reviewed the above documentation for accuracy and completeness, and I agree with the above.  Sarina Ser, MD

## 2022-03-21 ENCOUNTER — Ambulatory Visit: Payer: BC Managed Care – PPO | Admitting: Dermatology

## 2022-03-21 DIAGNOSIS — L309 Dermatitis, unspecified: Secondary | ICD-10-CM

## 2022-03-21 DIAGNOSIS — L230 Allergic contact dermatitis due to metals: Secondary | ICD-10-CM

## 2022-03-21 NOTE — Progress Notes (Signed)
   Follow-Up Visit   Subjective  Audrey Robertson is a 61 y.o. female who presents for the following: 2nd day patch testing (Patient is here today to have patch test patches removed and to evaluate if any reactions so far).  The following portions of the chart were reviewed this encounter and updated as appropriate:   Tobacco  Allergies  Meds  Problems  Med Hx  Surg Hx  Fam Hx     Review of Systems:  No other skin or systemic complaints except as noted in HPI or Assessment and Plan.  Objective  Well appearing patient in no apparent distress; mood and affect are within normal limits.  A focused examination was performed including the . Relevant physical exam findings are noted in the Assessment and Plan.   Assessment & Plan  Dermatitis due to unknown cause Lips  Patches removed today. Strong reaction to gold today. Discussed with patient to avoid gold and gold containing products. Patient to avoid soaking and scrubbing area where patches were. Avoid excessive sweating. Discussed to mark every day until follow up if any reactions occur and which number it is.    T.R.U.E. Test - 03/21/22 0900       Test Information   Manufacturer Other    Location Back    Number of Test 36    Reading Interval Day 3    Panel Panel 1;Panel 2;Panel 3      Panel 1   1. Nickel Sulfate 0    2. Wool Alcohols 0    3. Neomycin Sulfate 0    4. Potassium Dichromate 0    5. Caine Mix 0    6. Fragrance Mix 0    7. Colophony 0    8. Paraben Mix 0    9. Negative Control 0    10. Balsam of [place] 0    11. Ethylenediamine Dihydrochloride 0    12. Cobalt Dichloride 0      Panel 2   13. p-tert Butylphenol Formaldehyde Resin 0    14. Epoxy Resin 0    15. Carba Mix 0    16.  Black Rubber Mix 0    17. Cl+ Me-Isothiazolinone 0    18. Quaternium-15 0    19. Methyldibromo Glutaronitrile 0    20. p-Phenylenediamine 0    21. Formaldehyde 0    22. Mercapto Mix 0    23. Thimerosal 0    24. Thiuram  Mix 0      Panel 3   25. Diazolidinyl Urea 0    26. Quinoline Mix 0    27. Tixocortol-21-Pivalate 0    28. Gold Sodium Thiosulfate 2    29. Imidazolidinyl Urea 0    30. Budesonide 0    31. Hydrocortisone-17-Butyrate 0    32. Mercaptobenzothiazole 0    33. Bacitracin 0    34. Parthenolide 0    35. Disperse Blue 106 0    36. 2-Bromo-2-Nitropropane-1,3-diol 0               Return for appointment as scheduled.  Maylene Roes, CMA, am acting as scribe for Armida Sans, MD . Documentation: I have reviewed the above documentation for accuracy and completeness, and I agree with the above.  Armida Sans, MD

## 2022-03-21 NOTE — Patient Instructions (Signed)
Due to recent changes in healthcare laws, you may see results of your pathology and/or laboratory studies on MyChart before the doctors have had a chance to review them. We understand that in some cases there may be results that are confusing or concerning to you. Please understand that not all results are received at the same time and often the doctors may need to interpret multiple results in order to provide you with the best plan of care or course of treatment. Therefore, we ask that you please give us 2 business days to thoroughly review all your results before contacting the office for clarification. Should we see a critical lab result, you will be contacted sooner.   If You Need Anything After Your Visit  If you have any questions or concerns for your doctor, please call our main line at [phone] and press option 4 to reach your doctor's medical assistant. If no one answers, please leave a voicemail as directed and we will return your call as soon as possible. Messages left after 4 pm will be answered the following business day.   You may also send us a message via MyChart. We typically respond to MyChart messages within 1-2 business days.  For prescription refills, please ask your pharmacy to contact our office. Our fax number is [phone].  If you have an urgent issue when the clinic is closed that cannot wait until the next business day, you can page your doctor at the number below.    Please note that while we do our best to be available for urgent issues outside of office hours, we are not available 24/7.   If you have an urgent issue and are unable to reach us, you may choose to seek medical care at your doctor's office, retail clinic, urgent care center, or emergency room.  If you have a medical emergency, please immediately call 911 or go to the emergency department.  Pager Numbers  - Dr. Kowalski: [phone]  - Dr. Moye: [phone]  - Dr. Stewart:  [phone]  In the event of inclement weather, please call our main line at [phone] for an update on the status of any delays or closures.  Dermatology Medication Tips: Please keep the boxes that topical medications come in in order to help keep track of the instructions about where and how to use these. Pharmacies typically print the medication instructions only on the boxes and not directly on the medication tubes.   If your medication is too expensive, please contact our office at [phone] option 4 or send us a message through MyChart.   We are unable to tell what your co-pay for medications will be in advance as this is different depending on your insurance coverage. However, we may be able to find a substitute medication at lower cost or fill out paperwork to get insurance to cover a needed medication.   If a prior authorization is required to get your medication covered by your insurance company, please allow us 1-2 business days to complete this process.  Drug prices often vary depending on where the prescription is filled and some pharmacies may offer cheaper prices.  The website www.goodrx.com contains coupons for medications through different pharmacies. The prices here do not account for what the cost may be with help from insurance (it may be cheaper with your insurance), but the website can give you the price if you did not use any insurance.  - You can print the associated coupon and take it with   your prescription to the pharmacy.  - You may also stop by our office during regular business hours and pick up a GoodRx coupon card.  - If you need your prescription sent electronically to a different pharmacy, notify our office through [facility] MyChart or by phone at [phone] option 4.     Si Usted Necesita Algo Despus de Su Visita  Tambin puede enviarnos un mensaje a travs de MyChart. Por lo general respondemos a los mensajes de MyChart en el transcurso de 1 a 2  das hbiles.  Para renovar recetas, por favor pida a su farmacia que se ponga en contacto con nuestra oficina. Nuestro nmero de fax es el [phone].  Si tiene un asunto urgente cuando la clnica est cerrada y que no puede esperar hasta el siguiente da hbil, puede llamar/localizar a su doctor(a) al nmero que aparece a continuacin.   Por favor, tenga en cuenta que aunque hacemos todo lo posible para estar disponibles para asuntos urgentes fuera del horario de oficina, no estamos disponibles las 24 horas del da, los 7 das de la semana.   Si tiene un problema urgente y no puede comunicarse con nosotros, puede optar por buscar atencin mdica  en el consultorio de su doctor(a), en una clnica privada, en un centro de atencin urgente o en una sala de emergencias.  Si tiene una emergencia mdica, por favor llame inmediatamente al 911 o vaya a la sala de emergencias.  Nmeros de bper  - Dr. Kowalski: [phone]  - Dra. Moye: [phone]  - Dra. Stewart: [phone]  En caso de inclemencias del tiempo, por favor llame a nuestra lnea principal al [phone] para una actualizacin sobre el estado de cualquier retraso o cierre.  Consejos para la medicacin en dermatologa: Por favor, guarde las cajas en las que vienen los medicamentos de uso tpico para ayudarle a seguir las instrucciones sobre dnde y cmo usarlos. Las farmacias generalmente imprimen las instrucciones del medicamento slo en las cajas y no directamente en los tubos del medicamento.   Si su medicamento es muy caro, por favor, pngase en contacto con nuestra oficina llamando al [phone] y presione la opcin 4 o envenos un mensaje a travs de MyChart.   No podemos decirle cul ser su copago por los medicamentos por adelantado ya que esto es diferente dependiendo de la cobertura de su seguro. Sin embargo, es posible que podamos encontrar un medicamento sustituto a menor costo o llenar un formulario para que el  seguro cubra el medicamento que se considera necesario.   Si se requiere una autorizacin previa para que su compaa de seguros cubra su medicamento, por favor permtanos de 1 a 2 das hbiles para completar este proceso.  Los precios de los medicamentos varan con frecuencia dependiendo del lugar de dnde se surte la receta y alguna farmacias pueden ofrecer precios ms baratos.  El sitio web www.goodrx.com tiene cupones para medicamentos de diferentes farmacias. Los precios aqu no tienen en cuenta lo que podra costar con la ayuda del seguro (puede ser ms barato con su seguro), pero el sitio web puede darle el precio si no utiliz ningn seguro.  - Puede imprimir el cupn correspondiente y llevarlo con su receta a la farmacia.  - Tambin puede pasar por nuestra oficina durante el horario de atencin regular y recoger una tarjeta de cupones de GoodRx.  - Si necesita que su receta se enve electrnicamente a una farmacia diferente, informe a nuestra oficina a travs de MyChart de [facility]   o por telfono llamando al [phone] y presione la opcin 4.  

## 2022-03-26 ENCOUNTER — Encounter: Payer: Self-pay | Admitting: Dermatology

## 2022-03-26 ENCOUNTER — Ambulatory Visit: Payer: BC Managed Care – PPO

## 2022-03-26 ENCOUNTER — Ambulatory Visit: Payer: BC Managed Care – PPO | Admitting: Dermatology

## 2022-03-26 DIAGNOSIS — L235 Allergic contact dermatitis due to other chemical products: Secondary | ICD-10-CM

## 2022-03-26 DIAGNOSIS — L230 Allergic contact dermatitis due to metals: Secondary | ICD-10-CM

## 2022-03-26 DIAGNOSIS — L309 Dermatitis, unspecified: Secondary | ICD-10-CM

## 2022-03-26 NOTE — Progress Notes (Signed)
   Follow-Up Visit   Subjective  Audrey Robertson is a 61 y.o. female who presents for the following: Dermatitis (Patch test final reading).  The following portions of the chart were reviewed this encounter and updated as appropriate:  Tobacco  Allergies  Meds  Problems  Med Hx  Surg Hx  Fam Hx     Review of Systems: No other skin or systemic complaints except as noted in HPI or Assessment and Plan.   Objective  Well appearing patient in no apparent distress; mood and affect are within normal limits.  A focused examination was performed including face, back. Relevant physical exam findings are noted in the Assessment and Plan.  upper cutaneous lip Clear today        Assessment & Plan  Dermatitis of lip upper cutaneous lip  Continue Pimecrolimus cream twice daily.   Use HC only for worse flares up to 1 week.    T.R.U.E. Test - 03/26/22 1600       Test Information   Manufacturer Other    Location Back    Number of Test 36    Reading Interval Day 8    Panel Panel 1;Panel 2;Panel 3      Panel 1   1. Nickel Sulfate 0    2. Wool Alcohols 0    3. Neomycin Sulfate 0    4. Potassium Dichromate 0    5. Caine Mix 0    6. Fragrance Mix 0    7. Colophony 0    8. Paraben Mix 0    9. Negative Control 0    10. Balsam of [place] 0    11. Ethylenediamine Dihydrochloride 0    12. Cobalt Dichloride 0      Panel 2   13. p-tert Butylphenol Formaldehyde Resin 0    14. Epoxy Resin 0    15. Carba Mix 0    16.  Black Rubber Mix 0    17. Cl+ Me-Isothiazolinone 3    18. Quaternium-15 0    19. Methyldibromo Glutaronitrile 0    20. p-Phenylenediamine 0    21. Formaldehyde 0    22. Mercapto Mix 0    23. Thimerosal 0    24. Thiuram Mix 0      Panel 3   25. Diazolidinyl Urea 0    26. Quinoline Mix 0    27. Tixocortol-21-Pivalate 0    28. Gold Sodium Thiosulfate 3    29. Imidazolidinyl Urea 0    30. Budesonide 0    31. Hydrocortisone-17-Butyrate 0    32.  Mercaptobenzothiazole 0    33. Bacitracin 0    34. Parthenolide 0    35. Disperse Blue 106 0    36. 2-Bromo-2-Nitropropane-1,3-diol 0              Related Medications pimecrolimus (ELIDEL) 1 % cream Apply topically 2 (two) times daily.  Reviewed her significant reactions to gold and Isothiazolinone. The patient informational literature and reviewed with the patient. She must avoid these to allergens in order to improve her condition.  She must read labels and not wear any gold jewelry.  Return in about 2 months (around 05/26/2022) for [facility] Follow Up.  I, Lawson Radar, CMA, am acting as scribe for Armida Sans, MD. Documentation: I have reviewed the above documentation for accuracy and completeness, and I agree with the above.  Armida Sans, MD

## 2022-03-26 NOTE — Patient Instructions (Addendum)
Continue Pimecrolimus cream twice daily.   Use HC only for worse flares up to 1 week.     Due to recent changes in healthcare laws, you may see results of your pathology and/or laboratory studies on MyChart before the doctors have had a chance to review them. We understand that in some cases there may be results that are confusing or concerning to you. Please understand that not all results are received at the same time and often the doctors may need to interpret multiple results in order to provide you with the best plan of care or course of treatment. Therefore, we ask that you please give [place] 2 business days to thoroughly review all your results before contacting the office for clarification. Should we see a critical lab result, you will be contacted sooner.   If You Need Anything After Your Visit  If you have any questions or concerns for your doctor, please call our main line at [phone] and press option 4 to reach your doctor's medical assistant. If no one answers, please leave a voicemail as directed and we will return your call as soon as possible. Messages left after 4 pm will be answered the following business day.   You may also send [place] a message via MyChart. We typically respond to MyChart messages within 1-2 business days.  For prescription refills, please ask your pharmacy to contact our office. Our fax number is [phone].  If you have an urgent issue when the clinic is closed that cannot wait until the next business day, you can page your doctor at the number below.    Please note that while we do our best to be available for urgent issues outside of office hours, we are not available 24/7.   If you have an urgent issue and are unable to reach [place], you may choose to seek medical care at your doctor's office, retail clinic, urgent care center, or emergency room.  If you have a medical emergency, please immediately call 911 or go to the emergency department.  Pager  Numbers  - Dr. Gwen Pounds: [phone]  - Dr. Neale Burly: [phone]  - Dr. Roseanne Reno: [phone]  In the event of inclement weather, please call our main line at [phone] for an update on the status of any delays or closures.  Dermatology Medication Tips: Please keep the boxes that topical medications come in in order to help keep track of the instructions about where and how to use these. Pharmacies typically print the medication instructions only on the boxes and not directly on the medication tubes.   If your medication is too expensive, please contact our office at [phone] option 4 or send [place] a message through MyChart.   We are unable to tell what your co-pay for medications will be in advance as this is different depending on your insurance coverage. However, we may be able to find a substitute medication at lower cost or fill out paperwork to get insurance to cover a needed medication.   If a prior authorization is required to get your medication covered by your insurance company, please allow [place] 1-2 business days to complete this process.  Drug prices often vary depending on where the prescription is filled and some pharmacies may offer cheaper prices.  The website www.goodrx.com contains coupons for medications through different pharmacies. The prices here do not account for what the cost may be with help from insurance (it may be cheaper with your insurance), but the website can give you  the price if you did not use any insurance.  - You can print the associated coupon and take it with your prescription to the pharmacy.  - You may also stop by our office during regular business hours and pick up a GoodRx coupon card.  - If you need your prescription sent electronically to a different pharmacy, notify our office through [facility] or by phone at [phone] option 4.     Si Usted Necesita Algo Despus de Su Visita  Tambin puede enviarnos un mensaje a travs de  Pharmacist, community. Por lo general respondemos a los mensajes de MyChart en el transcurso de 1 a 2 das hbiles.  Para renovar recetas, por favor pida a su farmacia que se ponga en contacto con nuestra oficina. Harland Dingwall de fax es [place] [phone].  Si tiene un asunto urgente cuando la clnica est cerrada y que no puede esperar hasta el siguiente da hbil, puede llamar/localizar a su doctor(a) al nmero que aparece a continuacin.   Por favor, tenga en cuenta que aunque hacemos todo lo posible para estar disponibles para asuntos urgentes fuera del horario de [place], no estamos disponibles las 24 horas del da, los 7 das de la [place].   Si tiene un problema urgente y no puede comunicarse con nosotros, puede optar por buscar atencin mdica  en el consultorio de su doctor(a), en una clnica privada, en un centro de atencin urgente o en una sala de emergencias.  Si tiene Engineering geologist, por favor llame inmediatamente al 911 o vaya a la sala de emergencias.  Nmeros de bper  - Dr. Nehemiah Massed: [phone]  - Dra. Moye: [phone]  - Dra. Nicole Kindred: [phone]  En caso de inclemencias del [place], por favor llame a Johnsie Kindred principal al [phone] para una actualizacin sobre el [place] de cualquier retraso o cierre.  Consejos para la medicacin en dermatologa: Por favor, guarde las cajas en las que vienen los medicamentos de uso tpico para ayudarle a seguir las instrucciones sobre dnde y cmo usarlos. Las farmacias generalmente imprimen las instrucciones del medicamento slo en las cajas y no directamente en los tubos del [place].   Si su medicamento es muy caro, por favor, pngase en contacto con Zigmund Daniel llamando al [phone] y presione la opcin 4 o envenos un mensaje a travs de Pharmacist, community.   No podemos decirle cul ser su copago por los medicamentos por adelantado ya que esto es diferente dependiendo de la cobertura de su seguro. Sin embargo, es posible que  podamos encontrar un medicamento sustituto a Electrical engineer un formulario para que el seguro cubra el medicamento que se considera necesario.   Si se requiere una autorizacin previa para que su compaa de seguros [place] su medicamento, por favor permtanos de 1 a 2 das hbiles para completar este proceso.  Los precios de los medicamentos varan con frecuencia dependiendo del Environmental consultant de dnde se surte la receta y alguna farmacias pueden ofrecer precios ms baratos.  El sitio web www.goodrx.com tiene cupones para medicamentos de Airline pilot. Los precios aqu no tienen en cuenta lo que podra costar con la ayuda del seguro (puede ser ms barato con su seguro), pero el sitio web puede darle el precio si no utiliz Research scientist (physical sciences).  - Puede imprimir el cupn correspondiente y llevarlo con su receta a la farmacia.  - Tambin puede pasar por nuestra oficina durante el horario de atencin regular y Charity fundraiser una tarjeta de cupones de GoodRx.  - Si necesita  Si necesita que su receta se enve electrnicamente a una farmacia diferente, informe a nuestra oficina a travs de MyChart de  o por telfono llamando al [phone] y presione la opcin 4.  

## 2022-03-27 ENCOUNTER — Encounter: Payer: Self-pay | Admitting: Dermatology

## 2022-03-28 ENCOUNTER — Ambulatory Visit: Payer: BC Managed Care – PPO

## 2022-03-30 ENCOUNTER — Encounter: Payer: Self-pay | Admitting: Dermatology

## 2022-03-31 ENCOUNTER — Encounter: Payer: Self-pay | Admitting: Dermatology

## 2022-04-16 ENCOUNTER — Other Ambulatory Visit: Payer: Self-pay

## 2022-04-16 ENCOUNTER — Telehealth: Payer: Self-pay

## 2022-04-16 MED ORDER — CLOBETASOL PROPIONATE 0.05 % EX CREA: TOPICAL_CREAM | CUTANEOUS | 0 refills | Status: DC

## 2022-04-16 NOTE — Telephone Encounter (Signed)
Called pt discussed she should avoid wearing gold in or on her skin, May send clobetasol cream daily for 2 weeks to reaction site of  back disp 15 g 0r  Sent to [facility] in [place] [place]

## 2022-04-16 NOTE — Telephone Encounter (Signed)
Patient called rash on her back from allergy testing- gold with a positive reaction is not getting any better, Hydrocortisone cream is not helping.

## 2022-05-21 ENCOUNTER — Telehealth: Payer: Self-pay

## 2022-05-21 NOTE — Telephone Encounter (Signed)
Patient called to see if she can get botox injections during her office visit next Wednesday at 8:15 f/u for Isk, discussed with patient we can not do Botox at that office visit, pt scheduled for March 27 for Botox and filler.   Patient would like to know if she restart Clobetasol cream for gold irritation, discussed with patient Clobetasol cream should not be used more than 2 weeks,   Topical steroids (such as triamcinolone, fluocinolone, fluocinonide, mometasone, clobetasol, halobetasol, betamethasone, hydrocortisone) can cause thinning and lightening of the skin if they are used for too long in the same area. Your physician has selected the right strength medicine for your problem and area affected on the body. Please use your medication only as directed by your physician to prevent side effects.

## 2022-05-30 ENCOUNTER — Ambulatory Visit: Payer: BC Managed Care – PPO | Admitting: Dermatology

## 2022-05-30 VITALS — BP 124/75

## 2022-05-30 DIAGNOSIS — L239 Allergic contact dermatitis, unspecified cause: Secondary | ICD-10-CM | POA: Diagnosis not present

## 2022-05-30 DIAGNOSIS — L821 Other seborrheic keratosis: Secondary | ICD-10-CM | POA: Diagnosis not present

## 2022-05-30 DIAGNOSIS — L988 Other specified disorders of the skin and subcutaneous tissue: Secondary | ICD-10-CM | POA: Diagnosis not present

## 2022-05-30 NOTE — Patient Instructions (Signed)
Cryotherapy Aftercare  Wash gently with soap and water everyday.   Apply Vaseline and Band-Aid daily until healed.     Due to recent changes in healthcare laws, you may see results of your pathology and/or laboratory studies on MyChart before the doctors have had a chance to review them. We understand that in some cases there may be results that are confusing or concerning to you. Please understand that not all results are received at the same time and often the doctors may need to interpret multiple results in order to provide you with the best plan of care or course of treatment. Therefore, we ask that you please give us 2 business days to thoroughly review all your results before contacting the office for clarification. Should we see a critical lab result, you will be contacted sooner.   If You Need Anything After Your Visit  If you have any questions or concerns for your doctor, please call our main line at [phone] and press option 4 to reach your doctor's medical assistant. If no one answers, please leave a voicemail as directed and we will return your call as soon as possible. Messages left after 4 pm will be answered the following business day.   You may also send us a message via MyChart. We typically respond to MyChart messages within 1-2 business days.  For prescription refills, please ask your pharmacy to contact our office. Our fax number is [phone].  If you have an urgent issue when the clinic is closed that cannot wait until the next business day, you can page your doctor at the number below.    Please note that while we do our best to be available for urgent issues outside of office hours, we are not available 24/7.   If you have an urgent issue and are unable to reach us, you may choose to seek medical care at your doctor's office, retail clinic, urgent care center, or emergency room.  If you have a medical emergency, please immediately call 911 or go to the  emergency department.  Pager Numbers  - Dr. Kowalski: [phone]  - Dr. Moye: [phone]  - Dr. Stewart: [phone]  In the event of inclement weather, please call our main line at [phone] for an update on the status of any delays or closures.  Dermatology Medication Tips: Please keep the boxes that topical medications come in in order to help keep track of the instructions about where and how to use these. Pharmacies typically print the medication instructions only on the boxes and not directly on the medication tubes.   If your medication is too expensive, please contact our office at [phone] option 4 or send us a message through MyChart.   We are unable to tell what your co-pay for medications will be in advance as this is different depending on your insurance coverage. However, we may be able to find a substitute medication at lower cost or fill out paperwork to get insurance to cover a needed medication.   If a prior authorization is required to get your medication covered by your insurance company, please allow us 1-2 business days to complete this process.  Drug prices often vary depending on where the prescription is filled and some pharmacies may offer cheaper prices.  The website www.goodrx.com contains coupons for medications through different pharmacies. The prices here do not account for what the cost may be with help from insurance (it may be cheaper with your insurance), but the website can   give you the price if you did not use any insurance.  - You can print the associated coupon and take it with your prescription to the pharmacy.  - You may also stop by our office during regular business hours and pick up a GoodRx coupon card.  - If you need your prescription sent electronically to a different pharmacy, notify our office through [place] MyChart or by phone at [phone] option 4.     Si Usted Necesita Algo Despus de Su Visita  Tambin puede  enviarnos un mensaje a travs de MyChart. Por lo general respondemos a los mensajes de MyChart en el transcurso de 1 a 2 das hbiles.  Para renovar recetas, por favor pida a su farmacia que se ponga en contacto con nuestra oficina. Nuestro nmero de fax es el [phone].  Si tiene un asunto urgente cuando la clnica est cerrada y que no puede esperar hasta el siguiente da hbil, puede llamar/localizar a su doctor(a) al nmero que aparece a continuacin.   Por favor, tenga en cuenta que aunque hacemos todo lo posible para estar disponibles para asuntos urgentes fuera del horario de oficina, no estamos disponibles las 24 horas del da, los 7 das de la semana.   Si tiene un problema urgente y no puede comunicarse con nosotros, puede optar por buscar atencin mdica  en el consultorio de su doctor(a), en una clnica privada, en un centro de atencin urgente o en una sala de emergencias.  Si tiene una emergencia mdica, por favor llame inmediatamente al 911 o vaya a la sala de emergencias.  Nmeros de bper  - Dr. Kowalski: [phone]  - Dra. Moye: [phone]  - Dra. Stewart: [phone]  En caso de inclemencias del tiempo, por favor llame a nuestra lnea principal al [phone] para una actualizacin sobre el [place] de cualquier retraso o cierre.  Consejos para la medicacin en dermatologa: Por favor, guarde las cajas en las que vienen los medicamentos de uso tpico para ayudarle a seguir las instrucciones sobre dnde y cmo usarlos. Las farmacias generalmente imprimen las instrucciones del medicamento slo en las cajas y no directamente en los tubos del medicamento.   Si su medicamento es muy caro, por favor, pngase en contacto con nuestra oficina llamando al [phone] y presione la opcin 4 o envenos un mensaje a travs de MyChart.   No podemos decirle cul ser su copago por los medicamentos por adelantado ya que esto es diferente dependiendo de la cobertura de su seguro.  Sin embargo, es posible que podamos encontrar un medicamento sustituto a menor costo o llenar un formulario para que el seguro cubra el medicamento que se considera necesario.   Si se requiere una autorizacin previa para que su compaa de seguros cubra su medicamento, por favor permtanos de 1 a 2 das hbiles para completar este proceso.  Los precios de los medicamentos varan con frecuencia dependiendo del lugar de dnde se surte la receta y alguna farmacias pueden ofrecer precios ms baratos.  El sitio web www.goodrx.com tiene cupones para medicamentos de diferentes farmacias. Los precios aqu no tienen en cuenta lo que podra costar con la ayuda del seguro (puede ser ms barato con su seguro), pero el sitio web puede darle el precio si no utiliz ningn seguro.  - Puede imprimir el cupn correspondiente y llevarlo con su receta a la farmacia.  - Tambin puede pasar por nuestra oficina durante el horario de atencin regular y recoger una tarjeta de cupones de GoodRx.  -   Si necesita que su receta se enve electrnicamente a una farmacia diferente, informe a nuestra oficina a travs de MyChart de [place] o por telfono llamando al [phone] y presione la opcin 4.  

## 2022-05-30 NOTE — Progress Notes (Signed)
   Follow-Up Visit   Subjective  Audrey Robertson is a 62 y.o. female who presents for the following: Follow-up (SK of face treated with LN2). The patient has spots, moles and lesions to be evaluated, some may be new or changing and the patient has concerns that these could be cancer.  The following portions of the chart were reviewed this encounter and updated as appropriate:   Tobacco  Allergies  Meds  Problems  Med Hx  Surg Hx  Fam Hx     Review of Systems:  No other skin or systemic complaints except as noted in HPI or Assessment and Plan.  Objective  Well appearing patient in no apparent distress; mood and affect are within normal limits.  A focused examination was performed including face. Relevant physical exam findings are noted in the Assessment and Plan.  Clear today  Right Temple x 2, Left Temple x 5 (7) Stuck-on, waxy, tan-brown papule or plaque --Discussed benign etiology and prognosis.    Assessment & Plan  Seborrheic keratosis (7) Right Temple x 2, Left Temple x 5  Residual SK previously treated with LN2 - Touch up = No charge today  Destruction of lesion - Right Temple x 2, Left Temple x 5 Complexity: simple   Destruction method: cryotherapy   Informed consent: discussed and consent obtained   Timeout:  patient name, date of birth, surgical site, and procedure verified Lesion destroyed using liquid nitrogen: Yes   Region frozen until ice ball extended beyond lesion: Yes   Outcome: patient tolerated procedure well with no complications   Post-procedure details: wound care instructions given    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 Restart Pimecrolimus cream qd-bid for flares  Elastosis of skin Discussed Alastin products with Cerave cream She does have an upcoming appointment for filler and Botox. Will plan Restylane Refyne or Volbella.  Return  for Follow up as scheduled.  I, Ashok Cordia, CMA, am acting as scribe for Sarina Ser, MD . Documentation: I have reviewed the above documentation for accuracy and completeness, and I agree with the above.  Sarina Ser, MD

## 2022-06-12 ENCOUNTER — Encounter: Payer: Self-pay | Admitting: Dermatology

## 2022-06-25 DIAGNOSIS — M7551 Bursitis of right shoulder: Secondary | ICD-10-CM | POA: Insufficient documentation

## 2022-06-25 DIAGNOSIS — M7501 Adhesive capsulitis of right shoulder: Secondary | ICD-10-CM | POA: Insufficient documentation

## 2022-08-07 ENCOUNTER — Telehealth: Payer: Self-pay

## 2022-08-07 MED ORDER — VALACYCLOVIR HCL 500 MG PO TABS: ORAL_TABLET | ORAL | 1 refills | Status: AC

## 2022-08-07 NOTE — Telephone Encounter (Signed)
Called to discussed we will call in a rx for Valtrcx 500 mg take 1 po bid x 7 days, start 1 day prior to BBL procedure   Sent to her pharmacy

## 2022-08-08 ENCOUNTER — Encounter: Payer: Self-pay | Admitting: Dermatology

## 2022-08-08 ENCOUNTER — Ambulatory Visit (INDEPENDENT_AMBULATORY_CARE_PROVIDER_SITE_OTHER): Payer: BC Managed Care – PPO | Admitting: Dermatology

## 2022-08-08 VITALS — BP 119/75 | HR 71

## 2022-08-08 DIAGNOSIS — L820 Inflamed seborrheic keratosis: Secondary | ICD-10-CM

## 2022-08-08 DIAGNOSIS — L738 Other specified follicular disorders: Secondary | ICD-10-CM

## 2022-08-08 DIAGNOSIS — L988 Other specified disorders of the skin and subcutaneous tissue: Secondary | ICD-10-CM

## 2022-08-08 NOTE — Progress Notes (Signed)
Follow-Up Visit   Subjective  Audrey Robertson is a 62 y.o. female who presents for the following: here for lip filler  The patient has spots, moles and lesions to be evaluated, some may be new or changing and the patient has concerns that these could be cancer.  The following portions of the chart were reviewed this encounter and updated as appropriate: medications, allergies, medical history  Review of Systems:  No other skin or systemic complaints except as noted in HPI or Assessment and Plan.  Objective  Well appearing patient in no apparent distress; mood and affect are within normal limits.   A focused examination was performed of the following areas: lips  Relevant exam findings are noted in the Assessment and Plan.               Assessment & Plan   FACIAL ELASTOSIS Exam: Rhytides and volume loss.  Treatment Plan: Prior to the procedure, the patient's past medical history, allergies and the rare but potential risks and complications were reviewed with the patient and a signed consent was obtained. Pre and post-treatment care was discussed and instructions provided.  Location: lips  Filler Type: Juvederm Volbella  Procedure: Lidocaine-tetracaine ointment was applied to treatment areas to achieve good local anesthesia. The area was prepped thoroughly with Puracyn. After introducing the needle into the desired treatment area, the syringe plunger was drawn back to ensure there was no flash of blood prior to injecting the filler in order to minimize risk of intravascular injection and vascular occlusion. After injection of the filler, the treated areas were cleansed and iced to reduce swelling. Post-treatment instructions were reviewed with the patient.       Lot [id number]  Exp 2022-11-28  Volbella Xc 1cc syringe   Patient tolerated the procedure well. The patient will call with any problems, questions or concerns prior to their next  appointment.  Recommend daily broad spectrum sunscreen SPF 30+ to sun-exposed areas, reapply every 2 hours as needed. Call for new or changing lesions.  Staying in the shade or wearing long sleeves, sun glasses (UVA+UVB protection) and wide brim hats (4-inch brim around the entire circumference of the hat) are also recommended for sun protection.   Sebaceous Hyperplasia - Small yellow papules with a central dell - Benign-appearing - Observe. Call for changes.  Destruction Procedure Note Destruction method: electrodesiccation Informed consent: discussed and consent obtained   Outcome: patient tolerated procedure well with no complications   Post-procedure details: wound care instructions given   Locations: right upper eyelid x 1 # of Lesions Treated: 1   Prior to procedure, discussed risks of blister formation, small wound, skin dyspigmentation, or rare scar following cryotherapy. Recommend Vaseline ointment to treated areas while healing.  INFLAMED SEBORRHEIC KERATOSIS Exam: Erythematous keratotic or waxy stuck-on papule or plaque.  Symptomatic, irritating, patient would like treated.  Benign-appearing.  Call clinic for new or changing lesions.   Prior to procedure, discussed risks of blister formation, small wound, skin dyspigmentation, or rare scar following treatment. Recommend Vaseline ointment to treated areas while healing.  Destruction Procedure Note Destruction method: cryotherapy   Informed consent: discussed and consent obtained   Lesion destroyed using liquid nitrogen: Yes   Outcome: patient tolerated procedure well with no complications   Post-procedure details: wound care instructions given   Locations: trunk  # of Lesions Treated: 17  SEBORRHEIC KERATOSIS - Stuck-on, waxy, tan-brown papules and/or plaques  - Benign-appearing - Discussed benign etiology and prognosis. - Observe -  Call for any changes  Return for follow-up as scheduled.  IRuthell Rummage,  CMA, am acting as scribe for Sarina Ser, MD.  Documentation: I have reviewed the above documentation for accuracy and completeness, and I agree with the above.  Sarina Ser, MD

## 2022-08-08 NOTE — Patient Instructions (Addendum)
Seborrheic Keratosis  What causes seborrheic keratoses? Seborrheic keratoses are harmless, common skin growths that first appear during adult life.  As time goes by, more growths appear.  Some people may develop a large number of them.  Seborrheic keratoses appear on both covered and uncovered body parts.  They are not caused by sunlight.  The tendency to develop seborrheic keratoses can be inherited.  They vary in color from skin-colored to gray, brown, or even black.  They can be either smooth or have a rough, warty surface.   Seborrheic keratoses are superficial and look as if they were stuck on the skin.  Under the microscope this type of keratosis looks like layers upon layers of skin.  That is why at times the top layer may seem to fall off, but the rest of the growth remains and re-grows.    Treatment Seborrheic keratoses do not need to be treated, but can easily be removed in the office.  Seborrheic keratoses often cause symptoms when they rub on clothing or jewelry.  Lesions can be in the way of shaving.  If they become inflamed, they can cause itching, soreness, or burning.  Removal of a seborrheic keratosis can be accomplished by freezing, burning, or surgery. If any spot bleeds, scabs, or grows rapidly, please return to have it checked, as these can be an indication of a skin cancer.  Cryotherapy Aftercare  Wash gently with soap and water everyday.   Apply Vaseline and Band-Aid daily until healed.      Due to recent changes in healthcare laws, you may see results of your pathology and/or laboratory studies on MyChart before the doctors have had a chance to review them. We understand that in some cases there may be results that are confusing or concerning to you. Please understand that not all results are received at the same time and often the doctors may need to interpret multiple results in order to provide you with the best plan of care or course of treatment. Therefore, we ask  that you please give us 2 business days to thoroughly review all your results before contacting the office for clarification. Should we see a critical lab result, you will be contacted sooner.   If You Need Anything After Your Visit  If you have any questions or concerns for your doctor, please call our main line at [phone] and press option 4 to reach your doctor's medical assistant. If no one answers, please leave a voicemail as directed and we will return your call as soon as possible. Messages left after 4 pm will be answered the following business day.   You may also send us a message via MyChart. We typically respond to MyChart messages within 1-2 business days.  For prescription refills, please ask your pharmacy to contact our office. Our fax number is [phone].  If you have an urgent issue when the clinic is closed that cannot wait until the next business day, you can page your doctor at the number below.    Please note that while we do our best to be available for urgent issues outside of office hours, we are not available 24/7.   If you have an urgent issue and are unable to reach us, you may choose to seek medical care at your doctor's office, retail clinic, urgent care center, or emergency room.  If you have a medical emergency, please immediately call 911 or go to the emergency department.  Pager Numbers  - Dr. Kowalski:   [phone]  - Dr. Moye: [phone]  - Dr. Stewart: [phone]  In the event of inclement weather, please call our main line at [phone] for an update on the status of any delays or closures.  Dermatology Medication Tips: Please keep the boxes that topical medications come in in order to help keep track of the instructions about where and how to use these. Pharmacies typically print the medication instructions only on the boxes and not directly on the medication tubes.   If your medication is too expensive, please contact our office at  [phone] option 4 or send us a message through MyChart.   We are unable to tell what your co-pay for medications will be in advance as this is different depending on your insurance coverage. However, we may be able to find a substitute medication at lower cost or fill out paperwork to get insurance to cover a needed medication.   If a prior authorization is required to get your medication covered by your insurance company, please allow us 1-2 business days to complete this process.  Drug prices often vary depending on where the prescription is filled and some pharmacies may offer cheaper prices.  The website www.goodrx.com contains coupons for medications through different pharmacies. The prices here do not account for what the cost may be with help from insurance (it may be cheaper with your insurance), but the website can give you the price if you did not use any insurance.  - You can print the associated coupon and take it with your prescription to the pharmacy.  - You may also stop by our office during regular business hours and pick up a GoodRx coupon card.  - If you need your prescription sent electronically to a different pharmacy, notify our office through [place] MyChart or by phone at [phone] option 4.     Si Usted Necesita Algo Despus de Su Visita  Tambin puede enviarnos un mensaje a travs de MyChart. Por lo general respondemos a los mensajes de MyChart en el transcurso de 1 a 2 das hbiles.  Para renovar recetas, por favor pida a su farmacia que se ponga en contacto con nuestra oficina. Nuestro nmero de fax es el [phone].  Si tiene un asunto urgente cuando la clnica est cerrada y que no puede esperar hasta el siguiente da hbil, puede llamar/localizar a su doctor(a) al nmero que aparece a continuacin.   Por favor, tenga en cuenta que aunque hacemos todo lo posible para estar disponibles para asuntos urgentes fuera del horario de oficina, no estamos  disponibles las 24 horas del da, los 7 das de la semana.   Si tiene un problema urgente y no puede comunicarse con nosotros, puede optar por buscar atencin mdica  en el consultorio de su doctor(a), en una clnica privada, en un centro de atencin urgente o en una sala de emergencias.  Si tiene una emergencia mdica, por favor llame inmediatamente al 911 o vaya a la sala de emergencias.  Nmeros de bper  - Dr. Kowalski: [phone]  - Dra. Moye: [phone]  - Dra. Stewart: [phone]  En caso de inclemencias del tiempo, por favor llame a nuestra lnea principal al [phone] para una actualizacin sobre el estado de cualquier retraso o cierre.  Consejos para la medicacin en dermatologa: Por favor, guarde las cajas en las que vienen los medicamentos de uso tpico para ayudarle a seguir las instrucciones sobre dnde y cmo usarlos. Las farmacias generalmente imprimen las instrucciones del medicamento slo en   las cajas y no directamente en los tubos del medicamento.   Si su medicamento es muy caro, por favor, pngase en contacto con nuestra oficina llamando al [phone] y presione la opcin 4 o envenos un mensaje a travs de MyChart.   No podemos decirle cul ser su copago por los medicamentos por adelantado ya que esto es diferente dependiendo de la cobertura de su seguro. Sin embargo, es posible que podamos encontrar un medicamento sustituto a menor costo o llenar un formulario para que el seguro cubra el medicamento que se considera necesario.   Si se requiere una autorizacin previa para que su compaa de seguros cubra su medicamento, por favor permtanos de 1 a 2 das hbiles para completar este proceso.  Los precios de los medicamentos varan con frecuencia dependiendo del lugar de dnde se surte la receta y alguna farmacias pueden ofrecer precios ms baratos.  El sitio web www.goodrx.com tiene cupones para medicamentos de diferentes farmacias. Los precios aqu no  tienen en cuenta lo que podra costar con la ayuda del seguro (puede ser ms barato con su seguro), pero el sitio web puede darle el precio si no utiliz ningn seguro.  - Puede imprimir el cupn correspondiente y llevarlo con su receta a la farmacia.  - Tambin puede pasar por nuestra oficina durante el horario de atencin regular y recoger una tarjeta de cupones de GoodRx.  - Si necesita que su receta se enve electrnicamente a una farmacia diferente, informe a nuestra oficina a travs de MyChart de [place] o por telfono llamando al [phone] y presione la opcin 4.  

## 2022-08-16 ENCOUNTER — Telehealth: Payer: Self-pay

## 2022-08-16 NOTE — Telephone Encounter (Signed)
Patient left a voicemail regarding filler from last week. Patient states she has noticed a slight difference from one side to the other. Patient is questioning how long should this take to even out OR how soon does she need to be seen for a touch up. aw

## 2022-08-17 NOTE — Telephone Encounter (Signed)
Patient advised to give the filler 3 weeks. She states she has seen more improvement since leaving nurse voicemail. She is okay and will wait longer for better results at this time. aw

## 2022-09-18 ENCOUNTER — Ambulatory Visit (INDEPENDENT_AMBULATORY_CARE_PROVIDER_SITE_OTHER): Payer: BC Managed Care – PPO | Admitting: Dermatology

## 2022-09-18 ENCOUNTER — Encounter: Payer: Self-pay | Admitting: Dermatology

## 2022-09-18 DIAGNOSIS — L988 Other specified disorders of the skin and subcutaneous tissue: Secondary | ICD-10-CM

## 2022-09-18 NOTE — Progress Notes (Unsigned)
   Follow-Up Visit   Subjective  Audrey Robertson is a 62 y.o. female who presents for the following: Botox for facial elastosis. Dr. Gwen Pounds recently performed lip injections and the patient is pleased with results.  The following portions of the chart were reviewed this encounter and updated as appropriate: medications, allergies, medical history  Review of Systems:  No other skin or systemic complaints except as noted in HPI or Assessment and Plan.  Objective  Well appearing patient in no apparent distress; mood and affect are within normal limits.  A focused examination was performed of the face.  Relevant physical exam findings are noted in the Assessment and Plan.                Before botox photos  Injection map photo      Assessment & Plan    Facial Elastosis /Botox  Location: See attached image  Informed consent: Discussed risks (infection, pain, bleeding, bruising, swelling, allergic reaction, paralysis of nearby muscles, eyelid droop, double vision, neck weakness, difficulty breathing, headache, undesirable cosmetic result, and need for additional treatment) and benefits of the procedure, as well as the alternatives.  Informed consent was obtained.  Preparation: The area was cleansed with alcohol.  Procedure Details:  Botox was injected into the dermis with a 30-gauge needle. Pressure applied to any bleeding. Ice packs offered for swelling.  Lot Number:  [id number] Expiration:  08/2024  Total Units Injected:  33.5  Plan: Tylenol may be used for headache.  Allow 2 weeks before returning to clinic for additional dosing as needed. Patient will call for any problems.   FACIAL ELASTOSIS/FILLER Exam: Rhytides and volume loss.  Treatment Plan: Observe  Discussed issue with lip filler from last appointment. Patient states every thing has calmed down and she is happy with the results.   Recommend daily broad spectrum sunscreen SPF 30+ to  sun-exposed areas, reapply every 2 hours as needed. Call for new or changing lesions.  Staying in the shade or wearing long sleeves, sun glasses (UVA+UVB protection) and wide brim hats (4-inch brim around the entire circumference of the hat) are also recommended for sun protection.    Return in about 3 months (around 12/19/2022) for Botox.  I, Lawson Radar, CMA, am acting as scribe for Armida Sans, MD.  Documentation: I have reviewed the above documentation for accuracy and completeness, and I agree with the above.  Armida Sans, MD

## 2022-09-18 NOTE — Patient Instructions (Signed)
Due to recent changes in healthcare laws, you may see results of your pathology and/or laboratory studies on MyChart before the doctors have had a chance to review them. We understand that in some cases there may be results that are confusing or concerning to you. Please understand that not all results are received at the same time and often the doctors may need to interpret multiple results in order to provide you with the best plan of care or course of treatment. Therefore, we ask that you please give us 2 business days to thoroughly review all your results before contacting the office for clarification. Should we see a critical lab result, you will be contacted sooner.   If You Need Anything After Your Visit  If you have any questions or concerns for your doctor, please call our main line at [phone] and press option 4 to reach your doctor's medical assistant. If no one answers, please leave a voicemail as directed and we will return your call as soon as possible. Messages left after 4 pm will be answered the following business day.   You may also send us a message via MyChart. We typically respond to MyChart messages within 1-2 business days.  For prescription refills, please ask your pharmacy to contact our office. Our fax number is [phone].  If you have an urgent issue when the clinic is closed that cannot wait until the next business day, you can page your doctor at the number below.    Please note that while we do our best to be available for urgent issues outside of office hours, we are not available 24/7.   If you have an urgent issue and are unable to reach us, you may choose to seek medical care at your doctor's office, retail clinic, urgent care center, or emergency room.  If you have a medical emergency, please immediately call 911 or go to the emergency department.  Pager Numbers  - Dr. Kowalski: [phone]  - Dr. Moye: [phone]  - Dr. Stewart:  [phone]  In the event of inclement weather, please call our main line at [phone] for an update on the status of any delays or closures.  Dermatology Medication Tips: Please keep the boxes that topical medications come in in order to help keep track of the instructions about where and how to use these. Pharmacies typically print the medication instructions only on the boxes and not directly on the medication tubes.   If your medication is too expensive, please contact our office at [phone] option 4 or send us a message through MyChart.   We are unable to tell what your co-pay for medications will be in advance as this is different depending on your insurance coverage. However, we may be able to find a substitute medication at lower cost or fill out paperwork to get insurance to cover a needed medication.   If a prior authorization is required to get your medication covered by your insurance company, please allow us 1-2 business days to complete this process.  Drug prices often vary depending on where the prescription is filled and some pharmacies may offer cheaper prices.  The website www.goodrx.com contains coupons for medications through different pharmacies. The prices here do not account for what the cost may be with help from insurance (it may be cheaper with your insurance), but the website can give you the price if you did not use any insurance.  - You can print the associated coupon and take it with   your prescription to the pharmacy.  - You may also stop by our office during regular business hours and pick up a GoodRx coupon card.  - If you need your prescription sent electronically to a different pharmacy, notify our office through [facility] MyChart or by phone at [phone] option 4.     Si Usted Necesita Algo Despus de Su Visita  Tambin puede enviarnos un mensaje a travs de MyChart. Por lo general respondemos a los mensajes de MyChart en el transcurso de 1 a 2  das hbiles.  Para renovar recetas, por favor pida a su farmacia que se ponga en contacto con nuestra oficina. Nuestro nmero de fax es el [phone].  Si tiene un asunto urgente cuando la clnica est cerrada y que no puede esperar hasta el siguiente da hbil, puede llamar/localizar a su doctor(a) al nmero que aparece a continuacin.   Por favor, tenga en cuenta que aunque hacemos todo lo posible para estar disponibles para asuntos urgentes fuera del horario de oficina, no estamos disponibles las 24 horas del da, los 7 das de la semana.   Si tiene un problema urgente y no puede comunicarse con nosotros, puede optar por buscar atencin mdica  en el consultorio de su doctor(a), en una clnica privada, en un centro de atencin urgente o en una sala de emergencias.  Si tiene una emergencia mdica, por favor llame inmediatamente al 911 o vaya a la sala de emergencias.  Nmeros de bper  - Dr. Kowalski: [phone]  - Dra. Moye: [phone]  - Dra. Stewart: [phone]  En caso de inclemencias del tiempo, por favor llame a nuestra lnea principal al [phone] para una actualizacin sobre el estado de cualquier retraso o cierre.  Consejos para la medicacin en dermatologa: Por favor, guarde las cajas en las que vienen los medicamentos de uso tpico para ayudarle a seguir las instrucciones sobre dnde y cmo usarlos. Las farmacias generalmente imprimen las instrucciones del medicamento slo en las cajas y no directamente en los tubos del medicamento.   Si su medicamento es muy caro, por favor, pngase en contacto con nuestra oficina llamando al [phone] y presione la opcin 4 o envenos un mensaje a travs de MyChart.   No podemos decirle cul ser su copago por los medicamentos por adelantado ya que esto es diferente dependiendo de la cobertura de su seguro. Sin embargo, es posible que podamos encontrar un medicamento sustituto a menor costo o llenar un formulario para que el  seguro cubra el medicamento que se considera necesario.   Si se requiere una autorizacin previa para que su compaa de seguros cubra su medicamento, por favor permtanos de 1 a 2 das hbiles para completar este proceso.  Los precios de los medicamentos varan con frecuencia dependiendo del lugar de dnde se surte la receta y alguna farmacias pueden ofrecer precios ms baratos.  El sitio web www.goodrx.com tiene cupones para medicamentos de diferentes farmacias. Los precios aqu no tienen en cuenta lo que podra costar con la ayuda del seguro (puede ser ms barato con su seguro), pero el sitio web puede darle el precio si no utiliz ningn seguro.  - Puede imprimir el cupn correspondiente y llevarlo con su receta a la farmacia.  - Tambin puede pasar por nuestra oficina durante el horario de atencin regular y recoger una tarjeta de cupones de GoodRx.  - Si necesita que su receta se enve electrnicamente a una farmacia diferente, informe a nuestra oficina a travs de MyChart de [place]   o por telfono llamando al [phone] y presione la opcin 4.  

## 2022-09-19 ENCOUNTER — Ambulatory Visit: Payer: BC Managed Care – PPO | Admitting: Dermatology

## 2022-09-20 ENCOUNTER — Encounter: Payer: Self-pay | Admitting: Dermatology

## 2022-10-24 ENCOUNTER — Encounter: Payer: Self-pay | Admitting: Dermatology

## 2022-10-24 ENCOUNTER — Ambulatory Visit: Payer: BC Managed Care – PPO | Admitting: Dermatology

## 2022-10-24 VITALS — BP 140/69 | HR 60

## 2022-10-24 DIAGNOSIS — L821 Other seborrheic keratosis: Secondary | ICD-10-CM | POA: Diagnosis not present

## 2022-10-24 DIAGNOSIS — B078 Other viral warts: Secondary | ICD-10-CM | POA: Diagnosis not present

## 2022-10-24 DIAGNOSIS — D1801 Hemangioma of skin and subcutaneous tissue: Secondary | ICD-10-CM | POA: Diagnosis not present

## 2022-10-24 NOTE — Progress Notes (Signed)
Follow-Up Visit   Subjective  Audrey Robertson is a 62 y.o. female who presents for the following: Spots. Left jaw line, right leg at knee. Popped up quickly. Rough texture. Non tender on jaw, area at leg is tender.   The patient has spots, moles and lesions to be evaluated, some may be new or changing and the patient has concerns that these could be cancer.    The following portions of the chart were reviewed this encounter and updated as appropriate: medications, allergies, medical history  Review of Systems:  No other skin or systemic complaints except as noted in HPI or Assessment and Plan.  Objective  Well appearing patient in no apparent distress; mood and affect are within normal limits.  A focused examination was performed of the following areas: Face, neck, right leg Relevant physical exam findings are noted in the Assessment and Plan.  Right Lateral Knee x1 Verrucous papule  left jaw line x1, left cheek x1, left neck x1 (3) Stuck-on, waxy, tan-brown papule or plaque --Discussed benign etiology and prognosis.            Assessment & Plan   Other viral warts Right Lateral Knee x1  Vs ISK  Viral Wart (HPV) Counseling  Discussed viral / HPV (Human Papilloma Virus) etiology and risk of spread /infectivity to other areas of body as well as to other people.  Multiple treatments and methods may be required to clear warts and it is possible treatment may not be successful.  Treatment risks include discoloration; scarring and there is still potential for wart recurrence.  Destruction of lesion - Right Lateral Knee x1  Destruction method: cryotherapy   Informed consent: discussed and consent obtained   Lesion destroyed using liquid nitrogen: Yes   Region frozen until ice ball extended beyond lesion: Yes   Outcome: patient tolerated procedure well with no complications   Post-procedure details: wound care instructions given   Additional details:  Prior to  procedure, discussed risks of blister formation, small wound, skin dyspigmentation, or rare scar following cryotherapy. Recommend Vaseline ointment to treated areas while healing.   Seborrheic keratosis (3) left jaw line x1, left cheek x1, left neck x1  Reassured benign age-related growth.  Recommend observation.  Discussed cryotherapy if spot(s) become irritated or inflamed.  Discussed cosmetic procedure (cryotherapy), noncovered.  $60 for 1st lesion and $15 for each additional lesion if done on the same day.  Maximum charge $350.  One touch-up treatment included no charge. Discussed risks of treatment including dyspigmentation, small scar, and/or recurrence. Recommend daily broad spectrum sunscreen SPF 30+/photoprotection to treated areas once healed.   LN2 x 3 today, $90 cosmetic charge  Destruction of lesion - left jaw line x1, left cheek x1, left neck x1  Destruction method: cryotherapy   Informed consent: discussed and consent obtained   Lesion destroyed using liquid nitrogen: Yes   Region frozen until ice ball extended beyond lesion: Yes   Outcome: patient tolerated procedure well with no complications   Post-procedure details: wound care instructions given   Additional details:  Prior to procedure, discussed risks of blister formation, small wound, skin dyspigmentation, or rare scar following cryotherapy. Recommend Vaseline ointment to treated areas while healing.    HEMANGIOMA Exam: ~1 mm violaceous macule at medial right upper eyelid  Treatment Plan:  Discussed benign nature. Recommend observation. Call for changes.   SEBORRHEIC KERATOSIS - Stuck-on, waxy, tan-brown thin plaque at left jaw - Benign-appearing - Discussed benign etiology and prognosis. -  Observe - Call for any changes   Return for Follow Up As Scheduled.  I, Lawson Radar, CMA, am acting as scribe for Willeen Niece, MD.   Documentation: I have reviewed the above documentation for accuracy and  completeness, and I agree with the above.  Willeen Niece, MD

## 2022-10-24 NOTE — Patient Instructions (Signed)
Cryotherapy Aftercare  Wash gently with soap and water everyday.   Apply Vaseline and Band-Aid daily until healed.    Recommend daily broad spectrum sunscreen SPF 30+ to sun-exposed areas, reapply every 2 hours as needed. Call for new or changing lesions.  Staying in the shade or wearing long sleeves, sun glasses (UVA+UVB protection) and wide brim hats (4-inch brim around the entire circumference of the hat) are also recommended for sun protection.     Due to recent changes in healthcare laws, you may see results of your pathology and/or laboratory studies on MyChart before the doctors have had a chance to review them. We understand that in some cases there may be results that are confusing or concerning to you. Please understand that not all results are received at the same time and often the doctors may need to interpret multiple results in order to provide you with the best plan of care or course of treatment. Therefore, we ask that you please give us 2 business days to thoroughly review all your results before contacting the office for clarification. Should we see a critical lab result, you will be contacted sooner.   If You Need Anything After Your Visit  If you have any questions or concerns for your doctor, please call our main line at [phone] and press option 4 to reach your doctor's medical assistant. If no one answers, please leave a voicemail as directed and we will return your call as soon as possible. Messages left after 4 pm will be answered the following business day.   You may also send us a message via MyChart. We typically respond to MyChart messages within 1-2 business days.  For prescription refills, please ask your pharmacy to contact our office. Our fax number is [phone].  If you have an urgent issue when the clinic is closed that cannot wait until the next business day, you can page your doctor at the number below.    Please note that while we do our best to  be available for urgent issues outside of office hours, we are not available 24/7.   If you have an urgent issue and are unable to reach us, you may choose to seek medical care at your doctor's office, retail clinic, urgent care center, or emergency room.  If you have a medical emergency, please immediately call 911 or go to the emergency department.  Pager Numbers  - Dr. Kowalski: [phone]  - Dr. Moye: [phone]  - Dr. Stewart: [phone]  In the event of inclement weather, please call our main line at [phone] for an update on the status of any delays or closures.  Dermatology Medication Tips: Please keep the boxes that topical medications come in in order to help keep track of the instructions about where and how to use these. Pharmacies typically print the medication instructions only on the boxes and not directly on the medication tubes.   If your medication is too expensive, please contact our office at [phone] option 4 or send us a message through MyChart.   We are unable to tell what your co-pay for medications will be in advance as this is different depending on your insurance coverage. However, we may be able to find a substitute medication at lower cost or fill out paperwork to get insurance to cover a needed medication.   If a prior authorization is required to get your medication covered by your insurance company, please allow us 1-2 business days to complete this process.    Drug prices often vary depending on where the prescription is filled and some pharmacies may offer cheaper prices.  The website www.goodrx.com contains coupons for medications through different pharmacies. The prices here do not account for what the cost may be with help from insurance (it may be cheaper with your insurance), but the website can give you the price if you did not use any insurance.  - You can print the associated coupon and take it with your prescription to the pharmacy.   - You may also stop by our office during regular business hours and pick up a GoodRx coupon card.  - If you need your prescription sent electronically to a different pharmacy, notify our office through [facility] MyChart or by phone at [phone] option 4.     Si Usted Necesita Algo Despus de Su Visita  Tambin puede enviarnos un mensaje a travs de MyChart. Por lo general respondemos a los mensajes de MyChart en el transcurso de 1 a 2 das hbiles.  Para renovar recetas, por favor pida a su farmacia que se ponga en contacto con nuestra oficina. Nuestro nmero de fax es el [phone].  Si tiene un asunto urgente cuando la clnica est cerrada y que no puede esperar hasta el siguiente da hbil, puede llamar/localizar a su doctor(a) al nmero que aparece a continuacin.   Por favor, tenga en cuenta que aunque hacemos todo lo posible para estar disponibles para asuntos urgentes fuera del horario de oficina, no estamos disponibles las 24 horas del da, los 7 das de la semana.   Si tiene un problema urgente y no puede comunicarse con nosotros, puede optar por buscar atencin mdica  en el consultorio de su doctor(a), en una clnica privada, en un centro de atencin urgente o en una sala de emergencias.  Si tiene una emergencia mdica, por favor llame inmediatamente al 911 o vaya a la sala de emergencias.  Nmeros de bper  - Dr. Kowalski: [phone]  - Dra. Moye: [phone]  - Dra. Stewart: [phone]  En caso de inclemencias del tiempo, por favor llame a nuestra lnea principal al [phone] para una actualizacin sobre el estado de cualquier retraso o cierre.  Consejos para la medicacin en dermatologa: Por favor, guarde las cajas en las que vienen los medicamentos de uso tpico para ayudarle a seguir las instrucciones sobre dnde y cmo usarlos. Las farmacias generalmente imprimen las instrucciones del medicamento slo en las cajas y no directamente en los tubos del  medicamento.   Si su medicamento es muy caro, por favor, pngase en contacto con nuestra oficina llamando al [phone] y presione la opcin 4 o envenos un mensaje a travs de MyChart.   No podemos decirle cul ser su copago por los medicamentos por adelantado ya que esto es diferente dependiendo de la cobertura de su seguro. Sin embargo, es posible que podamos encontrar un medicamento sustituto a menor costo o llenar un formulario para que el seguro cubra el medicamento que se considera necesario.   Si se requiere una autorizacin previa para que su compaa de seguros cubra su medicamento, por favor permtanos de 1 a 2 das hbiles para completar este proceso.  Los precios de los medicamentos varan con frecuencia dependiendo del lugar de dnde se surte la receta y alguna farmacias pueden ofrecer precios ms baratos.  El sitio web www.goodrx.com tiene cupones para medicamentos de diferentes farmacias. Los precios aqu no tienen en cuenta lo que podra costar con la ayuda del seguro (puede ser ms   barato con su seguro), pero el sitio web puede darle el precio si no utiliz ningn seguro.  - Puede imprimir el cupn correspondiente y llevarlo con su receta a la farmacia.  - Tambin puede pasar por nuestra oficina durante el horario de atencin regular y recoger una tarjeta de cupones de GoodRx.  - Si necesita que su receta se enve electrnicamente a una farmacia diferente, informe a nuestra oficina a travs de MyChart de [place] o por telfono llamando al [phone] y presione la opcin 4.  

## 2022-12-18 ENCOUNTER — Encounter: Payer: Self-pay | Admitting: Dermatology

## 2022-12-18 ENCOUNTER — Ambulatory Visit (INDEPENDENT_AMBULATORY_CARE_PROVIDER_SITE_OTHER): Payer: BC Managed Care – PPO | Admitting: Dermatology

## 2022-12-18 VITALS — BP 140/84

## 2022-12-18 DIAGNOSIS — C4491 Basal cell carcinoma of skin, unspecified: Secondary | ICD-10-CM

## 2022-12-18 DIAGNOSIS — W908XXA Exposure to other nonionizing radiation, initial encounter: Secondary | ICD-10-CM

## 2022-12-18 DIAGNOSIS — L570 Actinic keratosis: Secondary | ICD-10-CM

## 2022-12-18 DIAGNOSIS — C44619 Basal cell carcinoma of skin of left upper limb, including shoulder: Secondary | ICD-10-CM

## 2022-12-18 DIAGNOSIS — L820 Inflamed seborrheic keratosis: Secondary | ICD-10-CM | POA: Diagnosis not present

## 2022-12-18 DIAGNOSIS — L821 Other seborrheic keratosis: Secondary | ICD-10-CM

## 2022-12-18 DIAGNOSIS — L814 Other melanin hyperpigmentation: Secondary | ICD-10-CM

## 2022-12-18 DIAGNOSIS — L578 Other skin changes due to chronic exposure to nonionizing radiation: Secondary | ICD-10-CM | POA: Diagnosis not present

## 2022-12-18 DIAGNOSIS — D489 Neoplasm of uncertain behavior, unspecified: Secondary | ICD-10-CM

## 2022-12-18 DIAGNOSIS — L988 Other specified disorders of the skin and subcutaneous tissue: Secondary | ICD-10-CM

## 2022-12-18 HISTORY — DX: Basal cell carcinoma of skin, unspecified: C44.91

## 2022-12-18 NOTE — Progress Notes (Unsigned)
Follow-Up Visit   Subjective  Audrey Robertson is a 62 y.o. female who presents for the following: Botox today  The patient has spots, moles and lesions to be evaluated, some may be new or changing and the patient may have concern these could be cancer.  The patient has spots, moles and lesions to be evaluated, some may be new or changing and the patient may have concern these could be cancer.   The following portions of the chart were reviewed this encounter and updated as appropriate: medications, allergies, medical history  Review of Systems:  No other skin or systemic complaints except as noted in HPI or Assessment and Plan.  Objective  Well appearing patient in no apparent distress; mood and affect are within normal limits.   A focused examination was performed of the following areas: Face   Relevant exam findings are noted in the Assessment and Plan.  Left Inframammary Fold, Left Shoulder - Anterior, Neck - Anterior (3), Right Elbow - Posterior, Right Inframammary Fold (3), Right Shoulder - Anterior Stuck-on, waxy, tan-brown papules and plaques -- Discussed benign etiology and prognosis.   Left Shoulder - Anterior 0.8 cm pink papule          Assessment & Plan   Facial Elastosis  Location:    Informed consent: Discussed risks (infection, pain, bleeding, bruising, swelling, allergic reaction, paralysis of nearby muscles, eyelid droop, double vision, neck weakness, difficulty breathing, headache, undesirable cosmetic result, and need for additional treatment) and benefits of the procedure, as well as the alternatives.  Informed consent was obtained.  Preparation: The area was cleansed with alcohol.  Procedure Details:  Botox was injected into the dermis with a 30-gauge needle. Pressure applied to any bleeding. Ice packs offered for swelling.  Lot Number:  [id number] Expiration:  10/2024  Total Units Injected:  36  Plan: Tylenol may be used for headache.   Allow 2 weeks before returning to clinic for additional dosing as needed. Patient will call for any problems.   Inflamed seborrheic keratosis (10) Left Shoulder - Anterior; Right Shoulder - Anterior; Right Elbow - Posterior; Neck - Anterior (3); Left Inframammary Fold; Right Inframammary Fold (3)  Symptomatic, irritating, patient would like treated.  Benign-appearing.  Call clinic for new or changing lesions.    Destruction of lesion - Left Inframammary Fold, Left Shoulder - Anterior, Neck - Anterior (3), Right Elbow - Posterior, Right Inframammary Fold (3), Right Shoulder - Anterior Complexity: simple   Destruction method: cryotherapy   Informed consent: discussed and consent obtained   Timeout:  patient name, date of birth, surgical site, and procedure verified Lesion destroyed using liquid nitrogen: Yes   Region frozen until ice ball extended beyond lesion: Yes   Cryotherapy cycles:  31 Outcome: patient tolerated procedure well with no complications   Post-procedure details: wound care instructions given    Neoplasm of uncertain behavior Left Shoulder - Anterior  Epidermal / dermal shaving  Lesion diameter (cm):  0.8 Informed consent: discussed and consent obtained   Timeout: patient name, date of birth, surgical site, and procedure verified   Procedure prep:  Patient was prepped and draped in usual sterile fashion Prep type:  Isopropyl alcohol Anesthesia: the lesion was anesthetized in a standard fashion   Anesthetic:  1% lidocaine w/ epinephrine 1-100,000 buffered w/ 8.4% NaHCO3 Instrument used: flexible razor blade   Hemostasis achieved with: pressure, aluminum chloride and electrodesiccation   Outcome: patient tolerated procedure well   Post-procedure details: sterile dressing  applied and wound care instructions given   Dressing type: bandage and petrolatum    Destruction of lesion Complexity: extensive   Destruction method: electrodesiccation and curettage    Informed consent: discussed and consent obtained   Timeout:  patient name, date of birth, surgical site, and procedure verified Procedure prep:  Patient was prepped and draped in usual sterile fashion Prep type:  Isopropyl alcohol Anesthesia: the lesion was anesthetized in a standard fashion   Anesthetic:  1% lidocaine w/ epinephrine 1-100,000 buffered w/ 8.4% NaHCO3 Curettage performed in three different directions: Yes   Electrodesiccation performed over the curetted area: Yes   Hemostasis achieved with:  pressure and aluminum chloride Outcome: patient tolerated procedure well with no complications   Post-procedure details: sterile dressing applied and wound care instructions given   Dressing type: bandage and petrolatum    Specimen 1 - Surgical pathology Differential Diagnosis: BCC vs Other  Check Margins: No    Return for Botox in 3-4 months.  I, Joanie Coddington, CMA, am acting as scribe for Armida Sans, MD .   Documentation: I have reviewed the above documentation for accuracy and completeness, and I agree with the above.  Armida Sans, MD

## 2022-12-18 NOTE — Patient Instructions (Addendum)
Cryotherapy Aftercare  Wash gently with soap and water everyday.   Apply Vaseline and Band-Aid daily until healed.   Patient Handout: Wound Care for Skin Biopsy Site  Patient Handout: Wound Care for Skin Biopsy Site  Taking Care of Your Skin Biopsy Site  Proper care of the biopsy site is essential for promoting healing and minimizing scarring. This handout provides instructions on how to care for your biopsy site to ensure optimal recovery.  1. Cleaning the Wound:  Clean the biopsy site daily with gentle soap and water. Gently pat the area dry with a clean, soft towel. Avoid harsh scrubbing or rubbing the area, as this can irritate the skin and delay healing.  2. Applying Aquaphor and Bandage:  After cleaning the wound, apply a thin layer of Aquaphor ointment to the biopsy site. Cover the area with a sterile bandage to protect it from dirt, bacteria, and friction. Change the bandage daily or as needed if it becomes soiled or wet.  3. Continued Care for One Week:  Repeat the cleaning, Aquaphor application, and bandaging process daily for one week following the biopsy procedure. Keeping the wound clean and moist during this initial healing period will help prevent infection and promote optimal healing.  4. Massaging Aquaphor into the Area:  ---After one week, discontinue the use of bandages but continue to apply Aquaphor to the biopsy site. ----Gently massage the Aquaphor into the area using circular motions. ---Massaging the skin helps to promote circulation and prevent the formation of scar tissue.   Additional Tips:  Avoid exposing the biopsy site to direct sunlight during the healing process, as this can cause hyperpigmentation or worsen scarring. If you experience any signs of infection, such as increased redness, swelling, warmth, or drainage from the wound, contact your healthcare provider immediately. Follow any additional instructions provided by your healthcare  provider for caring for the biopsy site and managing any discomfort. Conclusion:  Taking proper care of your skin biopsy site is crucial for ensuring optimal healing and minimizing scarring. By following these instructions for cleaning, applying Aquaphor, and massaging the area, you can promote a smooth and successful recovery. If you have any questions or concerns about caring for your biopsy site, don't hesitate to contact your healthcare provider for guidance.     Due to recent changes in healthcare laws, you may see results of your pathology and/or laboratory studies on MyChart before the doctors have had a chance to review them. We understand that in some cases there may be results that are confusing or concerning to you. Please understand that not all results are received at the same time and often the doctors may need to interpret multiple results in order to provide you with the best plan of care or course of treatment. Therefore, we ask that you please give [place] 2 business days to thoroughly review all your results before contacting the office for clarification. Should we see a critical lab result, you will be contacted sooner.   If You Need Anything After Your Visit  If you have any questions or concerns for your doctor, please call our main line at [phone] and press option 4 to reach your doctor's medical assistant. If no one answers, please leave a voicemail as directed and we will return your call as soon as possible. Messages left after 4 pm will be answered the following business day.   You may also send [place] a message via MyChart. We typically respond to MyChart messages within 1-2  business days.  For prescription refills, please ask your pharmacy to contact our office. Our fax number is [phone].  If you have an urgent issue when the clinic is closed that cannot wait until the next business day, you can page your doctor at the number below.    Please note that while we do our  best to be available for urgent issues outside of office hours, we are not available 24/7.   If you have an urgent issue and are unable to reach [place], you may choose to seek medical care at your doctor's office, retail clinic, urgent care center, or emergency room.  If you have a medical emergency, please immediately call 911 or go to the emergency department.  Pager Numbers  - Dr. Gwen Pounds: [phone]  - Dr. Roseanne Reno: [phone]  In the event of inclement weather, please call our main line at [phone] for an update on the status of any delays or closures.  Dermatology Medication Tips: Please keep the boxes that topical medications come in in order to help keep track of the instructions about where and how to use these. Pharmacies typically print the medication instructions only on the boxes and not directly on the medication tubes.   If your medication is too expensive, please contact our office at [phone] option 4 or send [place] a message through MyChart.   We are unable to tell what your co-pay for medications will be in advance as this is different depending on your insurance coverage. However, we may be able to find a substitute medication at lower cost or fill out paperwork to get insurance to cover a needed medication.   If a prior authorization is required to get your medication covered by your insurance company, please allow [place] 1-2 business days to complete this process.  Drug prices often vary depending on where the prescription is filled and some pharmacies may offer cheaper prices.  The website www.goodrx.com contains coupons for medications through different pharmacies. The prices here do not account for what the cost may be with help from insurance (it may be cheaper with your insurance), but the website can give you the price if you did not use any insurance.  - You can print the associated coupon and take it with your prescription to the pharmacy.  - You may also stop  by our office during regular business hours and pick up a GoodRx coupon card.  - If you need your prescription sent electronically to a different pharmacy, notify our office through [facility] or by phone at [phone] option 4.     Si Usted Necesita Algo Despus de Su Visita  Tambin puede enviarnos un mensaje a travs de Clinical cytogeneticist. Por lo general respondemos a los mensajes de MyChart en el transcurso de 1 a 2 das hbiles.  Para renovar recetas, por favor pida a su farmacia que se ponga en contacto con nuestra oficina. Annie Sable de fax es [place] [phone].  Si tiene un asunto urgente cuando la clnica est cerrada y que no puede esperar hasta el siguiente da hbil, puede llamar/localizar a su doctor(a) al nmero que aparece a continuacin.   Por favor, tenga en cuenta que aunque hacemos todo lo posible para estar disponibles para asuntos urgentes fuera del horario de [place], no estamos disponibles las 24 horas del da, los 7 [address]  [address] de la [place].   Si tiene un problema urgente y no puede comunicarse con nosotros, puede optar por buscar atencin Sports administrator  de su doctor(a), en una clnica privada, en un centro de atencin urgente o en una sala de emergencias.  Si tiene Engineer, drilling, por favor llame inmediatamente al 911 o vaya a la sala de emergencias.  Nmeros de bper  - Dr. Gwen Pounds: [phone]  - Dra. Roseanne Reno: [phone]  En caso de inclemencias del [place], por favor llame a Lacy Duverney principal al [phone] para una actualizacin sobre el [place] de cualquier retraso o cierre.  Consejos para la medicacin en dermatologa: Por favor, guarde las cajas en las que vienen los medicamentos de uso tpico para ayudarle a seguir las instrucciones sobre dnde y cmo usarlos. Las farmacias generalmente imprimen las instrucciones del medicamento slo en las cajas y no directamente en los tubos del [place].   Si su medicamento es muy caro, por  favor, pngase en contacto con Rolm Gala llamando al [phone] y presione la opcin 4 o envenos un mensaje a travs de Clinical cytogeneticist.   No podemos decirle cul ser su copago por los medicamentos por adelantado ya que esto es diferente dependiendo de la cobertura de su seguro. Sin embargo, es posible que podamos encontrar un medicamento sustituto a Audiological scientist un formulario para que el seguro cubra el medicamento que se considera necesario.   Si se requiere una autorizacin previa para que su compaa de seguros [place] su medicamento, por favor permtanos de 1 a 2 das hbiles para completar [address]  Los precios de los medicamentos varan con frecuencia dependiendo del Environmental consultant de dnde se surte la receta y alguna farmacias pueden ofrecer precios ms baratos.  El sitio web www.goodrx.com tiene cupones para medicamentos de Health and safety inspector. Los precios aqu no tienen en cuenta lo que podra costar con la ayuda del seguro (puede ser ms barato con su seguro), pero el sitio web puede darle el precio si no utiliz Tourist information centre manager.  - Puede imprimir el cupn correspondiente y llevarlo con su receta a la farmacia.  - Tambin puede pasar por nuestra oficina durante el horario de atencin regular y Education officer, museum una tarjeta de cupones de GoodRx.  - Si necesita que su receta se enve electrnicamente a una farmacia diferente, informe a nuestra oficina a travs de MyChart de [place] o por telfono llamando al [phone] y presione la opcin 4.

## 2022-12-26 ENCOUNTER — Telehealth: Payer: Self-pay

## 2022-12-26 NOTE — Telephone Encounter (Signed)
Advised pt of bx results/sh ?

## 2022-12-26 NOTE — Telephone Encounter (Signed)
-----   Message from Armida Sans sent at 12/25/2022  6:04 PM EDT ----- Diagnosis Skin , left shoulder - anterior SUPERFICIAL BASAL CELL CARCINOMA  Cancer = BCC Superficial Already treated Recheck next visit

## 2022-12-27 ENCOUNTER — Encounter: Payer: Self-pay | Admitting: Dermatology

## 2023-01-01 ENCOUNTER — Other Ambulatory Visit: Payer: Self-pay

## 2023-01-01 ENCOUNTER — Telehealth: Payer: Self-pay

## 2023-01-01 MED ORDER — TRETINOIN 0.05 % EX CREA: TOPICAL_CREAM | Freq: Every day | CUTANEOUS | 11 refills | Status: DC

## 2023-01-01 NOTE — Telephone Encounter (Signed)
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  Pharmacy [facility] in [place]

## 2023-01-01 NOTE — Telephone Encounter (Signed)
Tretinoin .05% cream erx'd to [facility] in [place]

## 2023-01-24 DIAGNOSIS — M85852 Other specified disorders of bone density and structure, left thigh: Secondary | ICD-10-CM | POA: Insufficient documentation

## 2023-02-19 ENCOUNTER — Ambulatory Visit: Payer: BC Managed Care – PPO | Admitting: Dermatology

## 2023-04-02 ENCOUNTER — Ambulatory Visit (INDEPENDENT_AMBULATORY_CARE_PROVIDER_SITE_OTHER): Payer: BC Managed Care – PPO | Admitting: Dermatology

## 2023-04-02 ENCOUNTER — Encounter: Payer: Self-pay | Admitting: Dermatology

## 2023-04-02 DIAGNOSIS — Z1283 Encounter for screening for malignant neoplasm of skin: Secondary | ICD-10-CM | POA: Diagnosis not present

## 2023-04-02 DIAGNOSIS — L578 Other skin changes due to chronic exposure to nonionizing radiation: Secondary | ICD-10-CM | POA: Diagnosis not present

## 2023-04-02 DIAGNOSIS — L814 Other melanin hyperpigmentation: Secondary | ICD-10-CM

## 2023-04-02 DIAGNOSIS — W908XXA Exposure to other nonionizing radiation, initial encounter: Secondary | ICD-10-CM | POA: Diagnosis not present

## 2023-04-02 DIAGNOSIS — D229 Melanocytic nevi, unspecified: Secondary | ICD-10-CM

## 2023-04-02 DIAGNOSIS — L820 Inflamed seborrheic keratosis: Secondary | ICD-10-CM

## 2023-04-02 DIAGNOSIS — D3617 Benign neoplasm of peripheral nerves and autonomic nervous system of trunk, unspecified: Secondary | ICD-10-CM

## 2023-04-02 DIAGNOSIS — D492 Neoplasm of unspecified behavior of bone, soft tissue, and skin: Secondary | ICD-10-CM

## 2023-04-02 DIAGNOSIS — L738 Other specified follicular disorders: Secondary | ICD-10-CM

## 2023-04-02 DIAGNOSIS — L905 Scar conditions and fibrosis of skin: Secondary | ICD-10-CM

## 2023-04-02 DIAGNOSIS — D1801 Hemangioma of skin and subcutaneous tissue: Secondary | ICD-10-CM

## 2023-04-02 DIAGNOSIS — Z85828 Personal history of other malignant neoplasm of skin: Secondary | ICD-10-CM

## 2023-04-02 DIAGNOSIS — L539 Erythematous condition, unspecified: Secondary | ICD-10-CM

## 2023-04-02 DIAGNOSIS — L821 Other seborrheic keratosis: Secondary | ICD-10-CM

## 2023-04-02 DIAGNOSIS — Z7189 Other specified counseling: Secondary | ICD-10-CM

## 2023-04-02 NOTE — Patient Instructions (Addendum)

## 2023-04-02 NOTE — Progress Notes (Unsigned)
Follow-Up Visit   Subjective  Audrey Robertson is a 62 y.o. female who presents for the following: Skin Cancer Screening and Upper Body Skin Exam hx of BCC L ant shoulder recheck, check spot L arm, 48yr, no symptoms, check spot back, 62m, no symptoms, check spot back red has had a while, ISKs to treat inframammary   The patient presents for Upper Body Skin Exam (UBSE) for skin cancer screening and mole check. The patient has spots, moles and lesions to be evaluated, some may be new or changing and the patient may have concern these could be cancer.  The following portions of the chart were reviewed this encounter and updated as appropriate: medications, allergies, medical history  Review of Systems:  No other skin or systemic complaints except as noted in HPI or Assessment and Plan.  Objective  Well appearing patient in no apparent distress; mood and affect are within normal limits.  All skin waist up examined. Relevant physical exam findings are noted in the Assessment and Plan.  L chest x 2, R neck x 1, back x 16, L arm x 1, L inframammary x 12 (32) Stuck on waxy paps with erythema  R mid back braline 0.6cm flesh pap    Assessment & Plan   Inflamed seborrheic keratosis (32) L chest x 2, R neck x 1, back x 16, L arm x 1, L inframammary x 12  Symptomatic, irritating, patient would like treated.  Destruction of lesion - L chest x 2, R neck x 1, back x 16, L arm x 1, L inframammary x 12 (32) Complexity: simple   Destruction method: cryotherapy   Informed consent: discussed and consent obtained   Timeout:  patient name, date of birth, surgical site, and procedure verified Lesion destroyed using liquid nitrogen: Yes   Region frozen until ice ball extended beyond lesion: Yes   Outcome: patient tolerated procedure well with no complications   Post-procedure details: wound care instructions given    Neoplasm of skin - irritated nevus R/O dysplasia R mid back  braline  Epidermal / dermal shaving  Lesion diameter (cm):  0.6 Informed consent: discussed and consent obtained   Timeout: patient name, date of birth, surgical site, and procedure verified   Procedure prep:  Patient was prepped and draped in usual sterile fashion Prep type:  Isopropyl alcohol Anesthesia: the lesion was anesthetized in a standard fashion   Anesthetic:  1% lidocaine w/ epinephrine 1-100,000 buffered w/ 8.4% NaHCO3 Instrument used: flexible razor blade   Hemostasis achieved with: pressure, aluminum chloride and electrodesiccation   Outcome: patient tolerated procedure well   Post-procedure details: sterile dressing applied and wound care instructions given   Dressing type: bandage and petrolatum    Specimen 1 - Surgical pathology Differential Diagnosis: Irritated nevus r/o Atypia  Check Margins: yes 0.6cm flesh pap  Skin cancer screening performed today.  Actinic Damage - Chronic condition, secondary to cumulative UV/sun exposure - diffuse scaly erythematous macules with underlying dyspigmentation - Recommend daily broad spectrum sunscreen SPF 30+ to sun-exposed areas, reapply every 2 hours as needed.  - Staying in the shade or wearing long sleeves, sun glasses (UVA+UVB protection) and wide brim hats (4-inch brim around the entire circumference of the hat) are also recommended for sun protection.  - Call for new or changing lesions.  Lentigines, Seborrheic Keratoses, Hemangiomas - Benign normal skin lesions - Benign-appearing - Call for any changes  Melanocytic Nevi - Tan-brown and/or pink-flesh-colored symmetric macules and papules -  Benign appearing on exam today - Observation - Call clinic for new or changing moles - Recommend daily use of broad spectrum spf 30+ sunscreen to sun-exposed areas.   HISTORY OF BASAL CELL CARCINOMA OF THE SKIN - L ant shoulder  - No evidence of recurrence today - Recommend regular full body skin exams - Recommend daily  broad spectrum sunscreen SPF 30+ to sun-exposed areas, reapply every 2 hours as needed.  - Call if any new or changing lesions are noted between office visits  SCAR with erythema - approx 1 cm Secondary to [facility] treatment L ant shoulder OK to treat with Laser /BBL to decrease redness  (BCC has already been treated with method of approximately 98%  + cure rate. ) There is always some small risk of recurrence of skin cancer no matter what method of treatment used.  Observe for evidence of recurrence. Exam: scar with telangiectasias L ant shoulder Benign-appearing.  Observation.  Call clinic for new or changing lesions. Recommend daily broad spectrum sunscreen SPF 30+, reapply every 2 hours as needed. Treatment:  Counseling for BBL / IPL / Laser and Coordination of Care Discussed the treatment option of Broad Band Light (BBL) /Intense Pulsed Light (IPL)/ Laser for skin discoloration, including brown spots and redness.  Typically we recommend at least 1-3 treatment sessions about 5-8 weeks apart for best results.  Cannot have tanned skin when BBL performed, and regular use of sunscreen/photoprotection is advised after the procedure to help maintain results. The patient's condition may also require "maintenance treatments" in the future.  The fee for BBL / laser treatments is $350 per treatment session for the whole face.  A fee can be quoted for other parts of the body.  Insurance typically does not pay for BBL/laser treatments and therefore the fee is an out-of-pocket cost.   Sebaceous Hyperplasia chest - Small yellow papules with a central dell - Benign-appearing - Observe. Call for changes.  - Reviewed treatment options with Isotretinoin, but do not recommend any treatment at this time.  Return in 6 months (on 09/30/2023).  I, Ardis Rowan, RMA, am acting as scribe for Armida Sans, MD .   Documentation: I have reviewed the above documentation for accuracy and completeness, and I agree with  the above.  Armida Sans, MD

## 2023-04-03 ENCOUNTER — Encounter: Payer: Self-pay | Admitting: Dermatology

## 2023-04-03 LAB — SURGICAL PATHOLOGY

## 2023-04-04 ENCOUNTER — Telehealth: Payer: Self-pay

## 2023-04-04 ENCOUNTER — Ambulatory Visit (INDEPENDENT_AMBULATORY_CARE_PROVIDER_SITE_OTHER): Payer: Self-pay | Admitting: Dermatology

## 2023-04-04 DIAGNOSIS — L905 Scar conditions and fibrosis of skin: Secondary | ICD-10-CM

## 2023-04-04 NOTE — Progress Notes (Signed)
Follow-Up Visit   Subjective  Audrey Robertson is a 62 y.o. female who presents for the following: Scar at [facility] site L ant shoulder, patient here today for BBL laser treatment.   The following portions of the chart were reviewed this encounter and updated as appropriate: medications, allergies, medical history  Review of Systems:  No other skin or systemic complaints except as noted in HPI or Assessment and Plan.  Objective  Well appearing patient in no apparent distress; mood and affect are within normal limits.  A focused examination was performed of the following areas: The back   Relevant exam findings are noted in the Assessment and Plan.  L ant shoulder Dyspigmented smooth macule or patch.          Assessment & Plan     Scar L ant shoulder  SCAR with erythema - approx 1 cm Secondary to [facility] treatment L ant shoulder OK to treat with Laser /BBL to decrease redness  (BCC has already been treated with method of approximately 98%  + cure rate. ) There is always some small risk of recurrence of skin cancer no matter what method of treatment used.  Observe for evidence of recurrence. Exam: scar with telangiectasias L ant shoulder Benign-appearing.  Observation.  Call clinic for new or changing lesions. Recommend daily broad spectrum sunscreen SPF 30+, reapply every 2 hours as needed. Treatment:  Counseling for BBL / IPL / Laser and Coordination of Care Discussed the treatment option of Broad Band Light (BBL) /Intense Pulsed Light (IPL)/ Laser for skin discoloration, including brown spots and redness.  Typically we recommend at least 1-3 treatment sessions about 5-8 weeks apart for best results.  Cannot have tanned skin when BBL performed, and regular use of sunscreen/photoprotection is advised after the procedure to help maintain results. The patient's condition may also require "maintenance treatments" in the future.  The fee for BBL / laser treatments is $350 per  treatment session for the whole face.  A fee can be quoted for other parts of the body.  Insurance typically does not pay for BBL/laser treatments and therefore the fee is an out-of-pocket cost.   Photorejuvenation - L ant shoulder Prior to the procedure, the patient's past medical history, medications, allergies, and the rare but potential risks and complications were reviewed with the patient and a signed consent was obtained.  Pre and post treatment care was discussed and instructions provided.  Patient tolerated the procedure well.   Wynelle Link avoidance was stressed. The patient will call with any problems, questions or concerns prior to their next appointment.   [place] BBL - 04/04/23 1400      Patient Details   Skin Type: II    Anesthestic Cream Applied: No    Photo Takes: Yes    Consent Signed: Yes      Treatment Details   Date: 04/04/23    Treatment #: 1    Area: L ant shoulder    Filter: 1st Pass      1st Pass   Location: Other   L ant shoulder   Device: 560 filter    BBL j/cm2: 26    PW Msec Sec: 25    Cooling Temp: 20    Pulses: 9        Return for appointment as scheduled.  Maylene Roes, CMA, am acting as scribe for Armida Sans, MD .   Documentation: I have reviewed the above documentation for accuracy and completeness, and I agree  with the above.  Armida Sans, MD

## 2023-04-04 NOTE — Telephone Encounter (Signed)
Advised pt of bx results/sh ?

## 2023-04-04 NOTE — Telephone Encounter (Signed)
-----   Message from Armida Sans sent at 04/03/2023  5:45 PM EST ----- FINAL DIAGNOSIS        1. 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

## 2023-04-04 NOTE — Patient Instructions (Signed)

## 2023-04-09 ENCOUNTER — Encounter: Payer: Self-pay | Admitting: Dermatology

## 2023-04-30 ENCOUNTER — Encounter: Payer: Self-pay | Admitting: Dermatology

## 2023-04-30 DIAGNOSIS — L309 Dermatitis, unspecified: Secondary | ICD-10-CM

## 2023-05-27 ENCOUNTER — Ambulatory Visit: Payer: BC Managed Care – PPO

## 2023-05-27 MED ORDER — OPZELURA 1.5 % EX CREA
TOPICAL_CREAM | CUTANEOUS | 2 refills | Status: AC
Start: 2023-05-27 — End: ?

## 2023-05-27 NOTE — Addendum Note (Signed)
 Addended by: Asher Muir A on: 05/27/2023 10:10 AM   Modules accepted: Orders

## 2023-06-11 ENCOUNTER — Encounter: Payer: Self-pay | Admitting: Dermatology

## 2023-06-11 ENCOUNTER — Ambulatory Visit: Payer: BC Managed Care – PPO | Admitting: Dermatology

## 2023-06-11 DIAGNOSIS — L820 Inflamed seborrheic keratosis: Secondary | ICD-10-CM | POA: Diagnosis not present

## 2023-06-11 DIAGNOSIS — L658 Other specified nonscarring hair loss: Secondary | ICD-10-CM

## 2023-06-11 DIAGNOSIS — L239 Allergic contact dermatitis, unspecified cause: Secondary | ICD-10-CM | POA: Diagnosis not present

## 2023-06-11 DIAGNOSIS — L648 Other androgenic alopecia: Secondary | ICD-10-CM | POA: Diagnosis not present

## 2023-06-11 NOTE — Progress Notes (Signed)
   Follow-Up Visit   Subjective  Audrey Robertson is a 63 y.o. female who presents for the following: spot on bilateral thighs and back pt would like looked at, pt reports 2 spots still there on back that were treated with LN2 by Dr. Gwen Pounds previously. Pt also here for patch testing.   The patient has spots, moles and lesions to be evaluated, some may be new or changing and the patient may have concern these could be cancer.   The following portions of the chart were reviewed this encounter and updated as appropriate: medications, allergies, medical history  Review of Systems:  No other skin or systemic complaints except as noted in HPI or Assessment and Plan.  Objective  Well appearing patient in no apparent distress; mood and affect are within normal limits.  All findings within normal limits unless otherwise noted below.   A focused examination was performed of the following areas: Back, thighs  Relevant exam findings are noted in the Assessment and Plan.  L lower leg x2, L thigh x1, R thigh x2, R upper back x1, L subscapular back x1, (7) Stuck on waxy paps with erythema  Assessment & Plan   ALLERGIC CONTACT DERMATITIS, UNSPECIFIED TRIGGER  Patch testing per Dr Gwen Pounds TRUE patch test applied today  Related Procedures Patch Test INFLAMED SEBORRHEIC KERATOSIS (7) L lower leg x2, L thigh x1, R thigh x2, R upper back x1, L subscapular back x1, (7) Symptomatic, irritating, patient would like treated. Destruction of lesion - L lower leg x2, L thigh x1, R thigh x2, R upper back x1, L subscapular back x1, (7) Complexity: simple   Destruction method: cryotherapy   Informed consent: discussed and consent obtained   Timeout:  patient name, date of birth, surgical site, and procedure verified Lesion destroyed using liquid nitrogen: Yes   Region frozen until ice ball extended beyond lesion: Yes   Cryo cycles: 1 or 2. Outcome: patient tolerated procedure well with no  complications   Post-procedure details: wound care instructions given   FEMALE PATTERN HAIR LOSS  Discussed oral Minoxidil   Doses of oral minoxidil for hair loss are considered 'low dose'. This is because the doses used for hair loss are much lower than the doses which are used for conditions such as high blood pressure (hypertension). The doses used for hypertension are 10-40mg  per day.  Side effects are uncommon at the low doses (up to 2.5 mg/day) used to treat hair loss. Potential side effects, more commonly seen at higher doses, include: Increase in hair growth (hypertrichosis) elsewhere on face and body Temporary hair shedding upon starting medication which may last up to 4 weeks Ankle swelling, fluid retention, rapid weight gain more than 5 pounds Low blood pressure and feeling lightheaded or dizzy when standing up quickly Fast or irregular heartbeat Headaches   Patient defers today      Return for As scheduled , w/ Nurse .  Wynonia Lawman, CMA, am acting as scribe for Elie Goody, MD .   Documentation: I have reviewed the above documentation for accuracy and completeness, and I agree with the above.  Elie Goody, MD

## 2023-06-11 NOTE — Patient Instructions (Addendum)
Cryotherapy Aftercare  Wash gently with soap and water everyday.   Apply Vaseline and Band-Aid daily until healed.    Due to recent changes in healthcare laws, you may see results of your pathology and/or laboratory studies on MyChart before the doctors have had a chance to review them. We understand that in some cases there may be results that are confusing or concerning to you. Please understand that not all results are received at the same time and often the doctors may need to interpret multiple results in order to provide you with the best plan of care or course of treatment. Therefore, we ask that you please give [place] 2 business days to thoroughly review all your results before contacting the office for clarification. Should we see a critical lab result, you will be contacted sooner.   If You Need Anything After Your Visit  If you have any questions or concerns for your doctor, please call our main line at [phone] and press option 4 to reach your doctor's medical assistant. If no one answers, please leave a voicemail as directed and we will return your call as soon as possible. Messages left after 4 pm will be answered the following business day.   You may also send [place] a message via MyChart. We typically respond to MyChart messages within 1-2 business days.  For prescription refills, please ask your pharmacy to contact our office. Our fax number is [phone].  If you have an urgent issue when the clinic is closed that cannot wait until the next business day, you can page your doctor at the number below.    Please note that while we do our best to be available for urgent issues outside of office hours, we are not available 24/7.   If you have an urgent issue and are unable to reach [place], you may choose to seek medical care at your doctor's office, retail clinic, urgent care center, or emergency room.  If you have a medical emergency, please immediately call 911 or go to the emergency  department.  Pager Numbers  - Dr. Gwen Pounds: [phone]  - Dr. Roseanne Reno: [phone]  - Dr. Katrinka Blazing: [phone]   In the event of inclement weather, please call our main line at [phone] for an update on the status of any delays or closures.  Dermatology Medication Tips: Please keep the boxes that topical medications come in in order to help keep track of the instructions about where and how to use these. Pharmacies typically print the medication instructions only on the boxes and not directly on the medication tubes.   If your medication is too expensive, please contact our office at [phone] option 4 or send [place] a message through MyChart.   We are unable to tell what your co-pay for medications will be in advance as this is different depending on your insurance coverage. However, we may be able to find a substitute medication at lower cost or fill out paperwork to get insurance to cover a needed medication.   If a prior authorization is required to get your medication covered by your insurance company, please allow [place] 1-2 business days to complete this process.  Drug prices often vary depending on where the prescription is filled and some pharmacies may offer cheaper prices.  The website www.goodrx.com contains coupons for medications through different pharmacies. The prices here do not account for what the cost may be with help from insurance (it may be cheaper with your insurance), but the website can  give you the price if you did not use any insurance.  - You can print the associated coupon and take it with your prescription to the pharmacy.  - You may also stop by our office during regular business hours and pick up a GoodRx coupon card.  - If you need your prescription sent electronically to a different pharmacy, notify our office through [facility] or by phone at [phone] option 4.     Si Usted Necesita Algo Despus de Su Visita  Tambin puede enviarnos  un mensaje a travs de Clinical cytogeneticist. Por lo general respondemos a los mensajes de MyChart en el transcurso de 1 a 2 das hbiles.  Para renovar recetas, por favor pida a su farmacia que se ponga en contacto con nuestra oficina. Annie Sable de fax es [place] [phone].  Si tiene un asunto urgente cuando la clnica est cerrada y que no puede esperar hasta el siguiente da hbil, puede llamar/localizar a su doctor(a) al nmero que aparece a continuacin.   Por favor, tenga en cuenta que aunque hacemos todo lo posible para estar disponibles para asuntos urgentes fuera del horario de [place], no estamos disponibles las 24 horas del da, los 7 [address]  [address] de la [place].   Si tiene un problema urgente y no puede comunicarse con nosotros, puede optar por buscar atencin mdica  en el consultorio de su doctor(a), en una clnica privada, en un centro de atencin urgente o en una sala de emergencias.  Si tiene Engineer, drilling, por favor llame inmediatamente al 911 o vaya a la sala de emergencias.  Nmeros de bper  - Dr. Gwen Pounds: [phone]  - Dra. Roseanne Reno: [phone]  - Dr. Katrinka Blazing: [phone]   En caso de inclemencias del tiempo, por favor llame a Lacy Duverney principal al [phone] para una actualizacin sobre el [place] de cualquier retraso o cierre.  Consejos para la medicacin en dermatologa: Por favor, guarde las cajas en las que vienen los medicamentos de uso tpico para ayudarle a seguir las instrucciones sobre dnde y cmo usarlos. Las farmacias generalmente imprimen las instrucciones del medicamento slo en las cajas y no directamente en los tubos del [place].   Si su medicamento es muy caro, por favor, pngase en contacto con Rolm Gala llamando al [phone] y presione la opcin 4 o envenos un mensaje a travs de Clinical cytogeneticist.   No podemos decirle cul ser su copago por los medicamentos por adelantado ya que esto es diferente dependiendo de la cobertura de su seguro. Sin  embargo, es posible que podamos encontrar un medicamento sustituto a Audiological scientist un formulario para que el seguro cubra el medicamento que se considera necesario.   Si se requiere una autorizacin previa para que su compaa de seguros [place] su medicamento, por favor permtanos de 1 a 2 das hbiles para completar [address]  Los precios de los medicamentos varan con frecuencia dependiendo del Environmental consultant de dnde se surte la receta y alguna farmacias pueden ofrecer precios ms baratos.  El sitio web www.goodrx.com tiene cupones para medicamentos de Health and safety inspector. Los precios aqu no tienen en cuenta lo que podra costar con la ayuda del seguro (puede ser ms barato con su seguro), pero el sitio web puede darle el precio si no utiliz Tourist information centre manager.  - Puede imprimir el cupn correspondiente y llevarlo con su receta a la farmacia.  - Tambin puede pasar por nuestra oficina durante el horario de atencin regular y Education officer, museum una tarjeta de cupones de GoodRx.  -  Si necesita que su receta se enve electrnicamente a Psychiatrist, informe a nuestra oficina a travs de MyChart de [place] o por telfono llamando al [phone] y presione la opcin 4.

## 2023-06-13 ENCOUNTER — Ambulatory Visit (INDEPENDENT_AMBULATORY_CARE_PROVIDER_SITE_OTHER): Payer: BC Managed Care – PPO

## 2023-06-13 ENCOUNTER — Ambulatory Visit: Payer: BC Managed Care – PPO

## 2023-06-13 DIAGNOSIS — L239 Allergic contact dermatitis, unspecified cause: Secondary | ICD-10-CM

## 2023-06-13 NOTE — Progress Notes (Signed)
Patient here today for two day patch test reading. Patient advised how to read testing sites Friday - Monday with spouse. Patient advised do not soak nor scrub area. Photos taken.  Patient showed reaction to site numbers 23 and 28. I did find site number 35 questionable.   Dorathy Daft, RMA

## 2023-06-17 ENCOUNTER — Encounter: Payer: Self-pay | Admitting: Dermatology

## 2023-06-17 ENCOUNTER — Ambulatory Visit: Payer: BC Managed Care – PPO | Admitting: Dermatology

## 2023-06-17 DIAGNOSIS — L820 Inflamed seborrheic keratosis: Secondary | ICD-10-CM

## 2023-06-17 DIAGNOSIS — L239 Allergic contact dermatitis, unspecified cause: Secondary | ICD-10-CM

## 2023-06-17 NOTE — Progress Notes (Signed)
   Follow-Up Visit   Subjective  Audrey Robertson is a 63 y.o. female who presents for the following: patch her for final patch test reading.   The following portions of the chart were reviewed this encounter and updated as appropriate: medications, allergies, medical history  Review of Systems:  No other skin or systemic complaints except as noted in HPI or Assessment and Plan.  Objective  Well appearing patient in no apparent distress; mood and affect are within normal limits.    A focused examination was performed of the following areas:back    Relevant exam findings are noted in the Assessment and Plan.         Assessment & Plan   Allergic Contact dermatitis  TRUE patch test final reading   Positive  #3 neomycin Positive #23 thimerosal Positive #28 gold Positive #35 disperse blue 106  Continue Clobetasol .05% cream qd-bid prn until smooth on inflamed areas  Information handouts regarding allergens given to patient today.   Keep scheduled appointment with Dr Kirtland Bouchard in May.    INFLAMED SEBORRHEIC KERATOSIS  L lower leg,  L thigh, R thigh, R upper back,  L subscapular back  Continue using otc Vaseline daily   Return for scheduled appt 09/17/23 with Dr Kirtland Bouchard .  I, Angelique Holm, CMA, am acting as scribe for Elie Goody, MD .   Documentation: I have reviewed the above documentation for accuracy and completeness, and I agree with the above.  Elie Goody, MD

## 2023-06-17 NOTE — Patient Instructions (Addendum)
[email or web address]  Thiomersal allergy   Last reviewed: May 2023 Authors: Thomasene Ripple, Staff Writer (2002); Dr Reinaldo Berber, Dermatologist [phone])  Introduction  Reactions  Diagnosis  Treatment  Prevention  Alternative names  Further information  What is thiomersal and where is it found? Thiomersal is an organic compound containing mercury and thiosalicylate. It is an antiseptic with both bactericidal   and fungicidal action.  Historically, it was commonly used as a preservative in topical pharmaceutical preparations, cosmetics, and biological products such as vaccines. Its use is diminishing, especially in cosmetics.  Products that may contain thiomersal preservative Cosmetics  Pharmaceutical/self-hygiene products  Biological products  Make-up removers  Eye moisturizers  Eye shadows  Mascaras  Soap-free cleansers  Eye, ear and nose drops/ointments  Antiseptic sprays  Topical medications  First-aid product (tincture of Merthiolate)  Vaccines, particularly multi-dose vials  Antitoxins  Immune globulin preparations  Skin prick test antigens  What are the reactions to thiomersal? Localised allergic contact dermatitis (itchy red patches) may occur in sensitive individuals. This may include redness and swelling at the injection site when injectable thiomersal-containing products are used. Most reactions are usually mild and resolve spontaneously after a few days. Hypersensitivity reactions may be due to either the mercury or thiosalicylate component of thiomersal.  Although thiomersal as a preservative in vaccines (particularly multi-dose vials) has a long record of being safe and effective, there are concerns regarding exposure to mercury-containing compounds. In the [place], all vaccines administered to children 6 and below are thiomersal free as they are dispensed in single dose units.  Due to mercury exposure concerns, thiomersal use  has declined in recent years and many products, including vaccines, have been reformulated without thiomersal.  Am I allergic to thiomersal? Thiomersal allergy   can be diagnosed by performing patch tests using 0.1% thiomersal in aqueous solution.  Because thiomersal is used as a preservative in antigen preparations for scratch or intradermal testing, false positive reactions may occur in mercury-sensitive individuals.   Treatment of contact dermatitis due to thiomersal exposure If you are diagnosed with thiomersal allergy then avoid exposure to thiomersal-containing products.  Once the dermatitis appears on the skin, treatment is as for any acute dermatitis/eczema, eg, topical corticosteroids, emollients, treatment of any secondary bacterial infection (Staphylococcus aureus).  What should I do to avoid thiomersal allergy? Once thiomersal sensitivity is confirmed you should try to avoid exposure to any products containing thiomersal preservative. Read product labels and avoid products that contain thiomersal or any of its alternative names. If unsure, ask your pharmacist for advice or a suitable alternative.  Alert your doctor to the fact that you have an allergy to thiomersal. Wherever possible your doctor should use vaccines free of thiomersal. However, if you require vaccination with thiomersal-containing vaccines for which there are no alternatives, discuss with your doctor the risks versus benefits. In general, an allergic reaction   to thiomersal is much less a risk to bear than the potential illnesses from not being vaccinated.  Your dermatologist may have further specific advice, particularly if you are highly sensitive.  Alternative names of thiomersal Thimerosal Merthiolate Sodium ethylmercurithiosalicylate Mercurothiolate Thiomersalate Thiomersalan Mercurochrome Ethyl (2-mercaptobenzoato-S) mercury sodium salt [(o-carboxyphenyl)thio] Ethylmercury sodium salt  Due to  recent changes in healthcare laws, you may see results of your pathology and/or laboratory studies on MyChart before the doctors have had a chance to review them. We understand that in some cases there may be results that are confusing or concerning to you. Please understand that not all results  are received at the same time and often the doctors may need to interpret multiple results in order to provide you with the best plan of care or course of treatment. Therefore, we ask that you please give [place] 2 business days to thoroughly review all your results before contacting the office for clarification. Should we see a critical lab result, you will be contacted sooner.   If You Need Anything After Your Visit  If you have any questions or concerns for your doctor, please call our main line at [phone] and press option 4 to reach your doctor's medical assistant. If no one answers, please leave a voicemail as directed and we will return your call as soon as possible. Messages left after 4 pm will be answered the following business day.   You may also send [place] a message via MyChart. We typically respond to MyChart messages within 1-2 business days.  For prescription refills, please ask your pharmacy to contact our office. Our fax number is [phone].  If you have an urgent issue when the clinic is closed that cannot wait until the next business day, you can page your doctor at the number below.    Please note that while we do our best to be available for urgent issues outside of office hours, we are not available 24/7.   If you have an urgent issue and are unable to reach [place], you may choose to seek medical care at your doctor's office, retail clinic, urgent care center, or emergency room.  If you have a medical emergency, please immediately call 911 or go to the emergency department.  Pager Numbers  - Dr. Gwen Pounds: [phone]  - Dr. Roseanne Reno: [phone]  - Dr. Katrinka Blazing: [phone]   In the  event of inclement weather, please call our main line at [phone] for an update on the status of any delays or closures.  Dermatology Medication Tips: Please keep the boxes that topical medications come in in order to help keep track of the instructions about where and how to use these. Pharmacies typically print the medication instructions only on the boxes and not directly on the medication tubes.   If your medication is too expensive, please contact our office at [phone] option 4 or send [place] a message through MyChart.   We are unable to tell what your co-pay for medications will be in advance as this is different depending on your insurance coverage. However, we may be able to find a substitute medication at lower cost or fill out paperwork to get insurance to cover a needed medication.   If a prior authorization is required to get your medication covered by your insurance company, please allow [place] 1-2 business days to complete this process.  Drug prices often vary depending on where the prescription is filled and some pharmacies may offer cheaper prices.  The website www.goodrx.com contains coupons for medications through different pharmacies. The prices here do not account for what the cost may be with help from insurance (it may be cheaper with your insurance), but the website can give you the price if you did not use any insurance.  - You can print the associated coupon and take it with your prescription to the pharmacy.  - You may also stop by our office during regular business hours and pick up a GoodRx coupon card.  - If you need your prescription sent electronically to a different pharmacy, notify our office through [facility] or by phone at [phone] option  4.     Si Usted Necesita Algo Despus de Su Visita  Tambin puede enviarnos un mensaje a travs de Clinical cytogeneticist. Por lo general respondemos a los mensajes de MyChart en el transcurso de 1 a 2 das hbiles.  Para  renovar recetas, por favor pida a su farmacia que se ponga en contacto con nuestra oficina. Annie Sable de fax es [place] [phone].  Si tiene un asunto urgente cuando la clnica est cerrada y que no puede esperar hasta el siguiente da hbil, puede llamar/localizar a su doctor(a) al nmero que aparece a continuacin.   Por favor, tenga en cuenta que aunque hacemos todo lo posible para estar disponibles para asuntos urgentes fuera del horario de [place], no estamos disponibles las 24 horas del da, los 7 [address]  [address] de la [place].   Si tiene un problema urgente y no puede comunicarse con nosotros, puede optar por buscar atencin mdica  en el consultorio de su doctor(a), en una clnica privada, en un centro de atencin urgente o en una sala de emergencias.  Si tiene Engineer, drilling, por favor llame inmediatamente al 911 o vaya a la sala de emergencias.  Nmeros de bper  - Dr. Gwen Pounds: [phone]  - Dra. Roseanne Reno: [phone]  - Dr. Katrinka Blazing: [phone]   En caso de inclemencias del tiempo, por favor llame a Lacy Duverney principal al [phone] para una actualizacin sobre el [place] de cualquier retraso o cierre.  Consejos para la medicacin en dermatologa: Por favor, guarde las cajas en las que vienen los medicamentos de uso tpico para ayudarle a seguir las instrucciones sobre dnde y cmo usarlos. Las farmacias generalmente imprimen las instrucciones del medicamento slo en las cajas y no directamente en los tubos del [place].   Si su medicamento es muy caro, por favor, pngase en contacto con Rolm Gala llamando al [phone] y presione la opcin 4 o envenos un mensaje a travs de Clinical cytogeneticist.   No podemos decirle cul ser su copago por los medicamentos por adelantado ya que esto es diferente dependiendo de la cobertura de su seguro. Sin embargo, es posible que podamos encontrar un medicamento sustituto a Audiological scientist un formulario para que el seguro cubra el  medicamento que se considera necesario.   Si se requiere una autorizacin previa para que su compaa de seguros [place] su medicamento, por favor permtanos de 1 a 2 das hbiles para completar [address]  Los precios de los medicamentos varan con frecuencia dependiendo del Environmental consultant de dnde se surte la receta y alguna farmacias pueden ofrecer precios ms baratos.  El sitio web www.goodrx.com tiene cupones para medicamentos de Health and safety inspector. Los precios aqu no tienen en cuenta lo que podra costar con la ayuda del seguro (puede ser ms barato con su seguro), pero el sitio web puede darle el precio si no utiliz Tourist information centre manager.  - Puede imprimir el cupn correspondiente y llevarlo con su receta a la farmacia.  - Tambin puede pasar por nuestra oficina durante el horario de atencin regular y Education officer, museum una tarjeta de cupones de GoodRx.  - Si necesita que su receta se enve electrnicamente a una farmacia diferente, informe a nuestra oficina a travs de MyChart de [place] o por telfono llamando al [phone] y presione la opcin 4.

## 2023-06-19 ENCOUNTER — Encounter: Payer: Self-pay | Admitting: Dermatology

## 2023-07-01 ENCOUNTER — Telehealth: Payer: Self-pay

## 2023-07-01 NOTE — Telephone Encounter (Signed)
Spoke with patient today. She is still having break outs on her back from patch testing. She is leaving to go out of town this Saturday and clobetasol is not improving area. She would like to know can any alternative be prescribed to help?

## 2023-07-02 ENCOUNTER — Encounter: Payer: Self-pay | Admitting: Dermatology

## 2023-07-02 ENCOUNTER — Ambulatory Visit (INDEPENDENT_AMBULATORY_CARE_PROVIDER_SITE_OTHER): Payer: BC Managed Care – PPO | Admitting: Dermatology

## 2023-07-02 DIAGNOSIS — M7710 Lateral epicondylitis, unspecified elbow: Secondary | ICD-10-CM | POA: Insufficient documentation

## 2023-07-02 DIAGNOSIS — L2389 Allergic contact dermatitis due to other agents: Secondary | ICD-10-CM | POA: Diagnosis not present

## 2023-07-02 DIAGNOSIS — L700 Acne vulgaris: Secondary | ICD-10-CM

## 2023-07-02 DIAGNOSIS — L853 Xerosis cutis: Secondary | ICD-10-CM

## 2023-07-02 DIAGNOSIS — K219 Gastro-esophageal reflux disease without esophagitis: Secondary | ICD-10-CM | POA: Insufficient documentation

## 2023-07-02 DIAGNOSIS — G5760 Lesion of plantar nerve, unspecified lower limb: Secondary | ICD-10-CM | POA: Insufficient documentation

## 2023-07-02 MED ORDER — CLINDAMYCIN PHOS-BENZOYL PEROX 1.2-5 % EX GEL: CUTANEOUS | 6 refills | Status: AC

## 2023-07-02 MED ORDER — TRIAMCINOLONE ACETONIDE 10 MG/ML IJ SUSP
10.0000 mg | Freq: Once | INTRAMUSCULAR | Status: AC
  Administered 2023-07-02: 10 mg

## 2023-07-02 NOTE — Patient Instructions (Signed)

## 2023-07-02 NOTE — Progress Notes (Signed)
   Follow-Up Visit   Subjective  Audrey Robertson is a 63 y.o. female who presents for the following: pt had patch testing done 06/11/23 , used clobetasol cream for 2 weeks and still having reaction. Pt also requesting a refill on Clindamycin-benzoyl peroxide to spot treat acne flares.   The following portions of the chart were reviewed this encounter and updated as appropriate: medications, allergies, medical history  Review of Systems:  No other skin or systemic complaints except as noted in HPI or Assessment and Plan.  Objective  Well appearing patient in no apparent distress; mood and affect are within normal limits.   A focused examination was performed of the following areas: Back  Relevant exam findings are noted in the Assessment and Plan.    Assessment & Plan   ALLERGIC CONTACT DERMATITIS DUE TO OTHER AGENTS Mid Back Intralesional injection - Mid Back Location: R upper back   Informed Consent: Discussed risks (infection, pain, bleeding, bruising, thinning of the skin, loss of skin pigment, lack of resolution, and recurrence of lesion) and benefits of the procedure, as well as the alternatives. Informed consent was obtained. Preparation: The area was prepared a standard fashion.  Procedure Details: An intralesional injection was performed with 1.66mg /ml. 0.2 cc in total were injected.   Plan: The patient was instructed on post-op care. Recommend OTC analgesia as needed for pain.  NDC: 1610-9604-54 LOT: [id number] EXP: 09/10/2025  Related Medications triamcinolone acetonide (KENALOG) 10 MG/ML injection 10 mg  ACNE VULGARIS  Exam limited by makeup. No active lesions appreciated on face  Chronic, well-controlled on medication, at patient goal  Plan: Continue tretinoin 0.05% nightly Stop occlusive moisturizer EltaMD moisture seal. Start neutrogena hydroboost gel cream Continue clindamycin benzoyl peroxide daily PRN Related Medications Clindamycin-Benzoyl  Per, Refr, gel Every day PRN XEROSIS CUTIS    Return if symptoms worsen or fail to improve.  Wynonia Lawman, CMA, am acting as scribe for Elie Goody, MD .   Documentation: I have reviewed the above documentation for accuracy and completeness, and I agree with the above.  Elie Goody, MD

## 2023-09-17 ENCOUNTER — Ambulatory Visit: Payer: BC Managed Care – PPO | Admitting: Dermatology

## 2023-09-30 ENCOUNTER — Ambulatory Visit: Payer: BC Managed Care – PPO | Admitting: Dermatology

## 2023-10-08 ENCOUNTER — Ambulatory Visit: Admitting: Dermatology

## 2023-10-08 ENCOUNTER — Encounter: Payer: Self-pay | Admitting: Dermatology

## 2023-10-08 DIAGNOSIS — Z79899 Other long term (current) drug therapy: Secondary | ICD-10-CM

## 2023-10-08 DIAGNOSIS — L230 Allergic contact dermatitis due to metals: Secondary | ICD-10-CM

## 2023-10-08 DIAGNOSIS — Z7189 Other specified counseling: Secondary | ICD-10-CM

## 2023-10-08 NOTE — Patient Instructions (Signed)

## 2023-10-08 NOTE — Progress Notes (Signed)
   Follow-Up Visit   Subjective  Audrey Robertson is a 63 y.o. female who presents for the following: Patient c/o red spot on her back from patch testing she had here over 1 year ago which showed a allergic reaction to gold, patient was wore gold about 3 months ago, patient using Clobetasol  cream and Opzelura  cream prn.  The patient has spots, moles and lesions to be evaluated, some may be new or changing and the patient may have concern these could be cancer.  The following portions of the chart were reviewed this encounter and updated as appropriate: medications, allergies, medical history  Review of Systems:  No other skin or systemic complaints except as noted in HPI or Assessment and Plan.  Objective  Well appearing patient in no apparent distress; mood and affect are within normal limits.  A focused examination was performed of the following areas: back  Relevant exam findings are noted in the Assessment and Plan.     right upper back Scaly erythematous patch +/- edema and vesiculation. Square patch of erythema and edema at gold patch test site.  Assessment & Plan   ALLERGIC CONTACT DERMATITIS DUE TO METALS right upper back Discussed with patient this area will flare up when she comes in contact with gold.  Discussed treatment options: topical vs  IL injections or Dupixent injection  Rash at patch test site may persist or recur even after treatment. Contact with gold may make perioral rash recur and patch test site flare.  Restart Clobetasol  cream 3 nights a week  Restart Opzelura  cream daily  Intralesional injection - right upper back Location: right upper back   Informed Consent: Discussed risks (infection, pain, bleeding, bruising, thinning of the skin, loss of skin pigment, lack of resolution, and recurrence of lesion) and benefits of the procedure, as well as the alternatives. Informed consent was obtained. Preparation: The area was prepared a standard  fashion.  Anesthesia:none   Procedure Details: An intralesional injection was performed with Kenalog  2.5 mg/cc 0.2 cc in total were injected.  Kenalog  NDC  1610-9604-54 Lot   [id number] EXP  June 2027  Total number of injections: 1  Plan: The patient was instructed on post-op care. Recommend OTC analgesia as needed for pain.   Return if symptoms worsen or fail to improve.  IClara Crisp, CMA, am acting as scribe for Celine Collard, MD .   Documentation: I have reviewed the above documentation for accuracy and completeness, and I agree with the above.  Celine Collard, MD

## 2023-10-09 ENCOUNTER — Ambulatory Visit: Payer: BC Managed Care – PPO | Admitting: Dermatology

## 2023-10-19 ENCOUNTER — Encounter: Payer: Self-pay | Admitting: Dermatology

## 2023-11-13 ENCOUNTER — Ambulatory Visit: Admitting: Dermatology

## 2023-12-12 ENCOUNTER — Ambulatory Visit

## 2023-12-19 ENCOUNTER — Encounter

## 2023-12-20 ENCOUNTER — Ambulatory Visit

## 2023-12-26 ENCOUNTER — Encounter

## 2023-12-31 ENCOUNTER — Encounter

## 2023-12-31 ENCOUNTER — Ambulatory Visit: Admitting: Dermatology

## 2024-01-02 ENCOUNTER — Encounter

## 2024-01-06 ENCOUNTER — Encounter: Payer: Self-pay | Admitting: Dermatology

## 2024-01-06 MED ORDER — CLOBETASOL PROPIONATE 0.05 % EX CREA: TOPICAL_CREAM | CUTANEOUS | 0 refills | Status: AC

## 2024-01-07 ENCOUNTER — Encounter

## 2024-01-09 ENCOUNTER — Encounter

## 2024-01-14 ENCOUNTER — Encounter

## 2024-01-16 ENCOUNTER — Encounter

## 2024-01-21 ENCOUNTER — Encounter

## 2024-01-23 ENCOUNTER — Encounter

## 2024-01-28 ENCOUNTER — Encounter

## 2024-01-30 ENCOUNTER — Encounter

## 2024-02-04 ENCOUNTER — Encounter

## 2024-02-06 ENCOUNTER — Encounter

## 2024-02-11 ENCOUNTER — Encounter

## 2024-02-13 ENCOUNTER — Encounter

## 2024-03-10 ENCOUNTER — Ambulatory Visit: Admitting: Dermatology

## 2024-03-10 DIAGNOSIS — L578 Other skin changes due to chronic exposure to nonionizing radiation: Secondary | ICD-10-CM

## 2024-03-10 DIAGNOSIS — D225 Melanocytic nevi of trunk: Secondary | ICD-10-CM | POA: Diagnosis not present

## 2024-03-10 DIAGNOSIS — Z85828 Personal history of other malignant neoplasm of skin: Secondary | ICD-10-CM

## 2024-03-10 DIAGNOSIS — W908XXA Exposure to other nonionizing radiation, initial encounter: Secondary | ICD-10-CM

## 2024-03-10 DIAGNOSIS — D229 Melanocytic nevi, unspecified: Secondary | ICD-10-CM

## 2024-03-10 DIAGNOSIS — L820 Inflamed seborrheic keratosis: Secondary | ICD-10-CM | POA: Diagnosis not present

## 2024-03-10 DIAGNOSIS — Z79899 Other long term (current) drug therapy: Secondary | ICD-10-CM

## 2024-03-10 DIAGNOSIS — C44619 Basal cell carcinoma of skin of left upper limb, including shoulder: Secondary | ICD-10-CM | POA: Diagnosis not present

## 2024-03-10 DIAGNOSIS — L821 Other seborrheic keratosis: Secondary | ICD-10-CM | POA: Diagnosis not present

## 2024-03-10 DIAGNOSIS — B079 Viral wart, unspecified: Secondary | ICD-10-CM | POA: Diagnosis not present

## 2024-03-10 DIAGNOSIS — L230 Allergic contact dermatitis due to metals: Secondary | ICD-10-CM

## 2024-03-10 DIAGNOSIS — Z5111 Encounter for antineoplastic chemotherapy: Secondary | ICD-10-CM

## 2024-03-10 DIAGNOSIS — Z7189 Other specified counseling: Secondary | ICD-10-CM

## 2024-03-10 NOTE — Patient Instructions (Addendum)
 - Start 5-fluorouracil/calcipotriene cream twice a day for 14 days to affected areas including at left shoulder . Prescription sent to [facility]. Patient advised they will receive an email to purchase the medication online and have it sent to their home. Patient provided with handout reviewing treatment course and side effects and advised to call or message us  on MyChart with any concerns.  Reviewed course of treatment and expected reaction.  Patient advised to expect inflammation and crusting and advised that erosions are possible.  Patient advised to be diligent with sun protection during and after treatment. Counseled to keep medication out of reach of children and pets.     5-Fluorouracil/Calcipotriene Patient Education   Actinic keratoses are the dry, red scaly spots on the skin caused by sun damage. A portion of these spots can turn into skin cancer with time, and treating them can help prevent development of skin cancer.   Treatment of these spots requires removal of the defective skin cells. There are various ways to remove actinic keratoses, including freezing with liquid nitrogen, treatment with creams, or treatment with a blue light procedure in the office.   5-fluorouracil cream is a topical cream used to treat actinic keratoses. It works by interfering with the growth of abnormal fast-growing skin cells, such as actinic keratoses. These cells peel off and are replaced by healthy ones.   5-fluorouracil/calcipotriene is a combination of the 5-fluorouracil cream with a vitamin D analog cream called calcipotriene. The calcipotriene alone does not treat actinic keratoses. However, when it is combined with 5-fluorouracil, it helps the 5-fluorouracil treat the actinic keratoses much faster so that the same results can be achieved with a much shorter treatment time.  INSTRUCTIONS FOR 5-FLUOROURACIL/CALCIPOTRIENE CREAM:   5-fluorouracil/calcipotriene cream  typically only needs to be used for 4-7 days. A thin layer should be applied twice a day to the treatment areas recommended by your physician.   If your physician prescribed you separate tubes of 5-fluourouracil and calcipotriene, apply a thin layer of 5-fluorouracil followed by a thin layer of calcipotriene.   Avoid contact with your eyes, nostrils, and mouth. Do not use 5-fluorouracil/calcipotriene cream on infected or open wounds.   You will develop redness, irritation and some crusting at areas where you have pre-cancer damage/actinic keratoses. IF YOU DEVELOP PAIN, BLEEDING, OR SIGNIFICANT CRUSTING, STOP THE TREATMENT EARLY - you have already gotten a good response and the actinic keratoses should clear up well.  Wash your hands after applying 5-fluorouracil 5% cream on your skin.   A moisturizer or sunscreen with a minimum SPF 30 should be applied each morning.   Once you have finished the treatment, you can apply a thin layer of Vaseline twice a day to irritated areas to soothe and calm the areas more quickly. If you experience significant discomfort, contact your physician.  For some patients it is necessary to repeat the treatment for best results.  SIDE EFFECTS: When using 5-fluorouracil/calcipotriene cream, you may have mild irritation, such as redness, dryness, swelling, or a mild burning sensation. This usually resolves within 2 weeks. The more actinic keratoses you have, the more redness and inflammation you can expect during treatment. Eye irritation has been reported rarely. If this occurs, please let us  know.  If you have any trouble using this cream, please call the office. If you have any other questions about this information, please do not hesitate to ask me before you leave the office.  Instructions for [facility]  One  or more of your medications was sent to the [facility]. You will receive an email from them and can  purchase the medicine through that link. It will then be mailed to your home at the address you confirmed. If for any reason you do not receive an email from them, please check your spam folder. If you still do not find the email, please let us  know. [facility] phone number is [phone].   Seborrheic Keratosis  What causes seborrheic keratoses? Seborrheic keratoses are harmless, common skin growths that first appear during adult life.  As time goes by, more growths appear.  Some people may develop a large number of them.  Seborrheic keratoses appear on both covered and uncovered body parts.  They are not caused by sunlight.  The tendency to develop seborrheic keratoses can be inherited.  They vary in color from skin-colored to gray, brown, or even black.  They can be either smooth or have a rough, warty surface.   Seborrheic keratoses are superficial and look as if they were stuck on the skin.  Under the microscope this type of keratosis looks like layers upon layers of skin.  That is why at times the top layer may seem to fall off, but the rest of the growth remains and re-grows.    Treatment Seborrheic keratoses do not need to be treated, but can easily be removed in the office.  Seborrheic keratoses often cause symptoms when they rub on clothing or jewelry.  Lesions can be in the way of shaving.  If they become inflamed, they can cause itching, soreness, or burning.  Removal of a seborrheic keratosis can be accomplished by freezing, burning, or surgery. If any spot bleeds, scabs, or grows rapidly, please return to have it checked, as these can be an indication of a skin cancer.   Cryotherapy Aftercare  Wash gently with soap and water everyday.   Apply Vaseline and Band-Aid daily until healed.     Due to recent changes in healthcare laws, you may see results of your pathology and/or laboratory studies on MyChart before the doctors have had a chance to review them. We understand that in  some cases there may be results that are confusing or concerning to you. Please understand that not all results are received at the same time and often the doctors may need to interpret multiple results in order to provide you with the best plan of care or course of treatment. Therefore, we ask that you please give us  2 business days to thoroughly review all your results before contacting the office for clarification. Should we see a critical lab result, you will be contacted sooner.   If You Need Anything After Your Visit  If you have any questions or concerns for your doctor, please call our main line at [phone] and press option 4 to reach your doctor's medical assistant. If no one answers, please leave a voicemail as directed and we will return your call as soon as possible. Messages left after 4 pm will be answered the following business day.   You may also send us  a message via MyChart. We typically respond to MyChart messages within 1-2 business days.  For prescription refills, please ask your pharmacy to contact our office. Our fax number is [phone].  If you have an urgent issue when the clinic is closed that cannot wait until the next business day, you can page your doctor at the number below.  Please note that while we do our best to be available for urgent issues outside of office hours, we are not available 24/7.   If you have an urgent issue and are unable to reach us , you may choose to seek medical care at your doctor's office, retail clinic, urgent care center, or emergency room.  If you have a medical emergency, please immediately call 911 or go to the emergency department.  Pager Numbers  - Dr. Hester: [phone]  - Dr. Jackquline: [phone]  - Dr. Claudene: [phone]   - Dr. Raymund: [phone]  In the event of inclement weather, please call our main line at [phone] for an update on the status of any delays or closures.  Dermatology Medication  Tips: Please keep the boxes that topical medications come in in order to help keep track of the instructions about where and how to use these. Pharmacies typically print the medication instructions only on the boxes and not directly on the medication tubes.   If your medication is too expensive, please contact our office at [phone] option 4 or send us  a message through MyChart.   We are unable to tell what your co-pay for medications will be in advance as this is different depending on your insurance coverage. However, we may be able to find a substitute medication at lower cost or fill out paperwork to get insurance to cover a needed medication.   If a prior authorization is required to get your medication covered by your insurance company, please allow us  1-2 business days to complete this process.  Drug prices often vary depending on where the prescription is filled and some pharmacies may offer cheaper prices.  The website www.goodrx.com contains coupons for medications through different pharmacies. The prices here do not account for what the cost may be with help from insurance (it may be cheaper with your insurance), but the website can give you the price if you did not use any insurance.  - You can print the associated coupon and take it with your prescription to the pharmacy.  - You may also stop by our office during regular business hours and pick up a GoodRx coupon card.  - If you need your prescription sent electronically to a different pharmacy, notify our office through [facility] or by phone at [phone] option 4.     Si Usted Necesita Algo Despus de Su Visita  Tambin puede enviarnos un mensaje a travs de Clinical Cytogeneticist. Por lo general respondemos a los mensajes de MyChart en el transcurso de 1 a 2 das hbiles.  Para renovar recetas, por favor pida a su farmacia que se ponga en contacto con nuestra oficina. Randi lakes de fax es [place] [phone].  Si tiene un  asunto urgente cuando la clnica est cerrada y que no puede esperar hasta el siguiente da hbil, puede llamar/localizar a su doctor(a) al nmero que aparece a continuacin.   Por favor, tenga en cuenta que aunque hacemos todo lo posible para estar disponibles para asuntos urgentes fuera del horario de [place], no estamos disponibles las 24 horas del da, los 7 [address]  [address] de la [place].   Si tiene un problema urgente y no puede comunicarse con nosotros, puede optar por buscar atencin mdica  en el consultorio de su doctor(a), en una clnica privada, en un centro de atencin urgente o en una sala de emergencias.  Si tiene engineer, drilling, por favor llame inmediatamente al 911 o vaya a la sala de emergencias.  Nmeros  de bper  - Dr. Hester: [phone]  - Dra. Jackquline: [phone]  - Dr. Claudene: [phone]  - Dra. Kitts: [phone]  En caso de inclemencias del [place], por favor llame a nuestra lnea principal al [phone] para una actualizacin sobre el estado de cualquier retraso o cierre.  Consejos para la medicacin en dermatologa: Por favor, guarde las cajas en las que vienen los medicamentos de uso tpico para ayudarle a seguir las instrucciones sobre dnde y cmo usarlos. Las farmacias generalmente imprimen las instrucciones del medicamento slo en las cajas y no directamente en los tubos del [place].   Si su medicamento es muy caro, por favor, pngase en contacto con landry rieger llamando al [phone] y presione la opcin 4 o envenos un mensaje a travs de Clinical Cytogeneticist.   No podemos decirle cul ser su copago por los medicamentos por adelantado ya que esto es diferente dependiendo de la cobertura de su seguro. Sin embargo, es posible que podamos encontrar un medicamento sustituto a audiological scientist un formulario para que el seguro cubra el medicamento que se considera necesario.   Si se requiere una autorizacin previa para que su compaa de seguros cubra su  medicamento, por favor permtanos de 1 a 2 das hbiles para completar este proceso.  Los precios de los medicamentos varan con frecuencia dependiendo del environmental consultant de dnde se surte la receta y alguna farmacias pueden ofrecer precios ms baratos.  El sitio web www.goodrx.com tiene cupones para medicamentos de health and safety inspector. Los precios aqu no tienen en cuenta lo que podra costar con la ayuda del seguro (puede ser ms barato con su seguro), pero el sitio web puede darle el precio si no utiliz tourist information centre manager.  - Puede imprimir el cupn correspondiente y llevarlo con su receta a la farmacia.  - Tambin puede pasar por nuestra oficina durante el horario de atencin regular y education officer, museum una tarjeta de cupones de GoodRx.  - Si necesita que su receta se enve electrnicamente a una farmacia diferente, informe a nuestra oficina a travs de MyChart de [address] o por telfono llamando al [phone] y presione la opcin 4.

## 2024-03-10 NOTE — Progress Notes (Signed)
 Follow-Up Visit   Subjective  Audrey Robertson is a 63 y.o. female who presents for the following: patient here concerning spot at left shoulder that was bx proven bcc, also spots on chest and spot at left 3rd finger. The patient has spots, moles and lesions to be evaluated, some may be new or changing and the patient may have concern these could be cancer.  The following portions of the chart were reviewed this encounter and updated as appropriate: medications, allergies, medical history  Review of Systems:  No other skin or systemic complaints except as noted in HPI or Assessment and Plan.  Objective  Well appearing patient in no apparent distress; mood and affect are within normal limits.  A focused examination was performed of the following areas: Back , left shoulder, left index finger   Relevant exam findings are noted in the Assessment and Plan.  Left shoulder BCC   Left shoulder BCC      Benign nevi at back  Benign Nevi at back  Benign nevi at back  Benign nevi at back  Benign nevi at back    Right 3rd Finger Lateral Paronychium x 1 Verrucous papules -- Discussed viral etiology and contagion.  back x 15, wrist x 5, right elbow x 3 (23) Erythematous stuck-on, waxy papule or plaque Left Shoulder - Anterior Recurrent bcc at edge of scar    Assessment & Plan   HISTORY OF BASAL CELL CARCINOMA OF THE SKIN 12/18/2022 left shoulder anterior - ED&C  - No evidence of recurrence today - Recommend regular full body skin exams - Recommend daily broad spectrum sunscreen SPF 30+ to sun-exposed areas, reapply every 2 hours as needed.  - Call if any new or changing lesions are noted between office visits  SEBORRHEIC KERATOSIS - Stuck-on, waxy, tan-brown papules and/or plaques  - Benign-appearing - Discussed benign etiology and prognosis. - Observe - Call for any changes  MELANOCYTIC NEVI - multiple moles at back that should be checked at next year follow up  see photos Exam: Tan-brown and/or pink-flesh-colored symmetric macules and papules Treatment Plan: Benign appearing on exam today. Recommend observation. Call clinic for new or changing moles. Recommend daily use of broad spectrum spf 30+ sunscreen to sun-exposed areas.    Purpura - Chronic; persistent and recurrent.  Treatable, but not curable. - Violaceous macules and patches - Benign - Related to trauma, age, sun damage and/or use of blood thinners, chronic use of topical and/or oral steroids - Observe - Can use OTC arnica containing moisturizer such as Dermend Bruise Formula if desired - Call for worsening or other concerns   ACTINIC DAMAGE - chronic, secondary to cumulative UV radiation exposure/sun exposure over time - diffuse scaly erythematous macules with underlying dyspigmentation - Recommend daily broad spectrum sunscreen SPF 30+ to sun-exposed areas, reapply every 2 hours as needed.  - Recommend staying in the shade or wearing long sleeves, sun glasses (UVA+UVB protection) and wide brim hats (4-inch brim around the entire circumference of the hat). - Call for new or changing lesions.  ALLERGIC CONTACT DERMATITIS Exam: clear at exam  Much improved Chronic condition with duration or expected duration over one year. Currently well-controlled. Discussed with patient this area will flare up when she comes in contact with gold.  Discussed treatment options: topical vs  IL injections or Dupixent injection  Rash at patch test site may persist or recur even after treatment. Contact with gold may make perioral rash recur and patch test site flare.  Treatment Plan: If flared can restart  Clobetasol  cream 3 nights a week  Or Opzelura  cream daily   VIRAL WARTS, UNSPECIFIED TYPE Right 3rd Finger Lateral Paronychium x 1 Viral Wart (HPV) Counseling  Discussed viral / HPV (Human Papilloma Virus) etiology and risk of spread /infectivity to other areas of body as well as to other people.   Multiple treatments and methods may be required to clear warts and it is possible treatment may not be successful.  Treatment risks include discoloration; scarring and there is still potential for wart recurrence.  Discussed can use 5 fluorouracil / calcipotriene cream  to affected area daily  Destruction of lesion - Right 3rd Finger Lateral Paronychium x 1 Complexity: simple   Destruction method: cryotherapy   Informed consent: discussed and consent obtained   Timeout:  patient name, date of birth, surgical site, and procedure verified Lesion destroyed using liquid nitrogen: Yes   Region frozen until ice ball extended beyond lesion: Yes   Outcome: patient tolerated procedure well with no complications   Post-procedure details: wound care instructions given    INFLAMED SEBORRHEIC KERATOSIS (23) back x 15, wrist x 5, right elbow x 3 (23) Symptomatic, irritating, patient would like treated. Destruction of lesion - back x 15, wrist x 5, right elbow x 3 (23) Complexity: simple   Destruction method: cryotherapy   Informed consent: discussed and consent obtained   Timeout:  patient name, date of birth, surgical site, and procedure verified Lesion destroyed using liquid nitrogen: Yes   Region frozen until ice ball extended beyond lesion: Yes   Outcome: patient tolerated procedure well with no complications   Post-procedure details: wound care instructions given    BASAL CELL CARCINOMA (BCC) OF SKIN OF LEFT UPPER EXTREMITY INCLUDING SHOULDER Left Shoulder - Anterior See previous pathology 12/18/2022  Accession: [id number]   Discussed 3 options to treat recurrent bcc at left anterior shoulder  1 surgical excision to remove  2 Discussed deeper biopsy similar to [facility]   3  discussed  5-fluorouracil/calcipotriene cream twice a day for 14 days to affected areas including left anterior shoulder .   Patient prefers topical treatment  5-fluorouracil/calcipotriene cream twice a day for  several weeks 14  days to affected areas including left anterior shoulder. Prescription sent to [facility].    Return in about 3 months (around 06/10/2024) for recheck left shoulder and check moles on low back.  IEleanor Blush, CMA, am acting as scribe for Alm Rhyme, MD.   Documentation: I have reviewed the above documentation for accuracy and completeness, and I agree with the above.  Alm Rhyme, MD

## 2024-03-16 ENCOUNTER — Encounter: Payer: Self-pay | Admitting: Dermatology

## 2024-03-25 ENCOUNTER — Encounter: Payer: Self-pay | Admitting: Dermatology

## 2024-03-25 MED ORDER — TRETINOIN 0.05 % EX CREA: TOPICAL_CREAM | Freq: Every day | CUTANEOUS | 11 refills | Status: AC

## 2024-04-06 ENCOUNTER — Encounter: Payer: Self-pay | Admitting: Dermatology

## 2024-04-06 MED ORDER — MUPIROCIN 2 % EX OINT: TOPICAL_OINTMENT | CUTANEOUS | 0 refills | Status: AC

## 2024-05-26 ENCOUNTER — Encounter: Payer: Self-pay | Admitting: Dermatology

## 2024-06-09 ENCOUNTER — Encounter: Payer: Self-pay | Admitting: Dermatology

## 2024-06-09 ENCOUNTER — Ambulatory Visit: Admitting: Dermatology

## 2024-06-09 DIAGNOSIS — L821 Other seborrheic keratosis: Secondary | ICD-10-CM | POA: Diagnosis not present

## 2024-06-09 DIAGNOSIS — W908XXA Exposure to other nonionizing radiation, initial encounter: Secondary | ICD-10-CM | POA: Diagnosis not present

## 2024-06-09 DIAGNOSIS — D225 Melanocytic nevi of trunk: Secondary | ICD-10-CM

## 2024-06-09 DIAGNOSIS — B079 Viral wart, unspecified: Secondary | ICD-10-CM | POA: Diagnosis not present

## 2024-06-09 DIAGNOSIS — D229 Melanocytic nevi, unspecified: Secondary | ICD-10-CM

## 2024-06-09 DIAGNOSIS — L820 Inflamed seborrheic keratosis: Secondary | ICD-10-CM

## 2024-06-09 DIAGNOSIS — Z7189 Other specified counseling: Secondary | ICD-10-CM | POA: Diagnosis not present

## 2024-06-09 DIAGNOSIS — Z79899 Other long term (current) drug therapy: Secondary | ICD-10-CM

## 2024-06-09 DIAGNOSIS — D485 Neoplasm of uncertain behavior of skin: Secondary | ICD-10-CM

## 2024-06-09 DIAGNOSIS — L814 Other melanin hyperpigmentation: Secondary | ICD-10-CM

## 2024-06-09 DIAGNOSIS — L578 Other skin changes due to chronic exposure to nonionizing radiation: Secondary | ICD-10-CM

## 2024-06-09 DIAGNOSIS — D489 Neoplasm of uncertain behavior, unspecified: Secondary | ICD-10-CM

## 2024-06-09 DIAGNOSIS — Z85828 Personal history of other malignant neoplasm of skin: Secondary | ICD-10-CM | POA: Diagnosis not present

## 2024-06-09 DIAGNOSIS — C44519 Basal cell carcinoma of skin of other part of trunk: Secondary | ICD-10-CM | POA: Diagnosis not present

## 2024-06-09 NOTE — Progress Notes (Signed)
 "  Follow-Up Visit   Subjective  Audrey Robertson is a 64 y.o. female who presents for the following:  Recheck low back and left shoulder Hx of bcc  Recheck a spot on right  3rd finger  Recheck some spots at arms, spots at temples , spots at left hip   The patient has spots, moles and lesions to be evaluated, some may be new or changing and the patient may have concern these could be cancer. Pt declines total body skin exam / skin cancer screening today.  The following portions of the chart were reviewed this encounter and updated as appropriate: medications, allergies, medical history  Review of Systems:  No other skin or systemic complaints except as noted in HPI or Assessment and Plan.  Objective  Well appearing patient in no apparent distress; mood and affect are within normal limits.  A focused examination was performed of the following areas: Face, chest, arms, back, left shoulder   Relevant exam findings are noted in the Assessment and Plan.  face x 10, left anterior hip x 5, right lower quadrant abdomen x 1 (16) Erythematous stuck-on, waxy papule or plaque Right 3rd Finger Lateral Paronychium x 1 Verrucous papules -- Discussed viral etiology and contagion.  upper back right spinal 1.1 cm pearly pink patch   right low back 5.5 cm lateral to spine superior 0.4 cm brown macule   right low back 5.5 cm lateral to spine inferior 0.4 cm brown macule    Assessment & Plan    SEBORRHEIC KERATOSIS - Stuck-on, waxy, tan-brown papules and/or plaques  - Benign-appearing - Discussed benign etiology and prognosis. - Observe - Call for any changes  LENTIGINES Exam: scattered tan macules Due to sun exposure Treatment Plan: Benign-appearing, observe. Recommend daily broad spectrum sunscreen SPF 30+ to sun-exposed areas, reapply every 2 hours as needed.  Call for any changes Counseling for BBL / IPL / Laser and Coordination of Care Discussed the treatment option of  Broad Band Light (BBL) /Intense Pulsed Light (IPL)/ Laser for skin discoloration, including brown spots and redness.  Typically we recommend at least 1-3 treatment sessions about 5-8 weeks apart for best results.  Cannot have tanned skin when BBL performed, and regular use of sunscreen/photoprotection is advised after the procedure to help maintain results. The patient's condition may also require maintenance treatments in the future.  The fee for BBL / laser treatments is $350 per treatment session for the whole face.  A fee can be quoted for other parts of the body.  Insurance typically does not pay for BBL/laser treatments and therefore the fee is an out-of-pocket cost. Recommend prophylactic valtrex  treatment. Once scheduled for procedure, will send Rx in prior to patient's appointment.   MELANOCYTIC NEVI Exam: Tan-brown and/or pink-flesh-colored symmetric macules and papules See photos  Treatment Plan: Benign-appearing. Stable compared to previous visit. Observation.  Call clinic for new or changing moles.  Recommend daily use of broad spectrum spf 30+ sunscreen to sun-exposed areas.    ACTINIC DAMAGE - chronic, secondary to cumulative UV radiation exposure/sun exposure over time - diffuse scaly erythematous macules with underlying dyspigmentation - Recommend daily broad spectrum sunscreen SPF 30+ to sun-exposed areas, reapply every 2 hours as needed.  - Recommend staying in the shade or wearing long sleeves, sun glasses (UVA+UVB protection) and wide brim hats (4-inch brim around the entire circumference of the hat). - Call for new or changing lesions.  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  - Call if any new or changing lesions are noted between office visits  INFLAMED SEBORRHEIC KERATOSIS (16) face x 10, left anterior hip x 5, right lower quadrant abdomen x 1 (16) Symptomatic, irritating, patient would like treated. - Destruction of lesion - face x 10, left anterior hip x 5, right lower quadrant abdomen x 1 (16) Complexity: simple   Destruction method: cryotherapy   Informed consent: discussed and consent obtained   Timeout:  patient name, date of birth, surgical site, and procedure verified Lesion destroyed using liquid nitrogen: Yes   Region frozen until ice ball extended beyond lesion: Yes   Outcome: patient tolerated procedure well with no complications   Post-procedure details: wound care instructions given    VIRAL WARTS, UNSPECIFIED TYPE Right 3rd Finger Lateral Paronychium x 1 Viral Wart (HPV) Counseling  Discussed viral / HPV (Human Papilloma Virus) etiology and risk of spread /infectivity to other areas of body as well as to other people.  Multiple treatments and methods may be required to clear warts and it is possible treatment may not be successful.  Treatment risks include discoloration; scarring and there is still potential for wart recurrence.  Can restart 5 fluorouracil / calcipotriene cream  to affected area daily and cover  - Destruction of lesion - Right 3rd Finger Lateral Paronychium x 1 Complexity: simple   Destruction method: cryotherapy   Informed consent: discussed and consent obtained   Timeout:  patient name, date of birth, surgical site, and procedure verified Lesion destroyed using liquid nitrogen: Yes   Region frozen until ice ball extended beyond lesion: Yes   Outcome: patient tolerated procedure well with no complications   Post-procedure details: wound care instructions given    NEOPLASM OF UNCERTAIN BEHAVIOR (3) upper back right spinal - Epidermal / dermal  shaving  Lesion diameter (cm):  1.1 Informed consent: discussed and consent obtained   Timeout: patient name, date of birth, surgical site, and procedure verified   Procedure prep:  Patient was prepped and draped in usual sterile fashion Prep type:  Isopropyl alcohol Anesthesia: the lesion was anesthetized in a standard fashion   Anesthetic:  1% lidocaine w/ epinephrine 1-100,000 buffered w/ 8.4% NaHCO3 Instrument used: flexible razor blade   Hemostasis achieved with: pressure, aluminum chloride and electrodesiccation   Outcome: patient tolerated procedure well   Post-procedure details: sterile dressing applied and wound care instructions given   Dressing type: bandage and petrolatum    - Destruction of lesion Complexity: extensive   Destruction method: electrodesiccation and curettage   Informed consent: discussed and consent obtained   Timeout:  patient name, date of birth, surgical site, and procedure verified Procedure prep:  Patient was prepped and draped in usual sterile fashion Prep type:  Isopropyl alcohol Anesthesia: the lesion was anesthetized in a standard fashion   Anesthetic:  1% lidocaine w/ epinephrine 1-100,000 buffered w/ 8.4% NaHCO3 Curettage performed in three different directions: Yes   Electrodesiccation performed over the curetted area: Yes   Lesion length (cm):  1.1 Lesion width (cm):  1.1 Margin per side (cm):  0.2 Final wound size (cm):  1.5 Hemostasis achieved with:  pressure, aluminum chloride  and electrodesiccation Outcome: patient tolerated procedure well with no complications   Post-procedure details: sterile dressing applied and wound care instructions given   Dressing type: bandage and petrolatum    Specimen 1 - Surgical pathology Differential Diagnosis: r/o bcc  ED&C today   Check Margins: No right low back 5.5 cm lateral to spine superior - Epidermal / dermal shaving  Lesion diameter (cm):  0.4 Informed consent: discussed and consent  obtained   Timeout: patient name, date of birth, surgical site, and procedure verified   Procedure prep:  Patient was prepped and draped in usual sterile fashion Prep type:  Isopropyl alcohol Anesthesia: the lesion was anesthetized in a standard fashion   Anesthetic:  1% lidocaine w/ epinephrine 1-100,000 buffered w/ 8.4% NaHCO3 Instrument used: flexible razor blade   Hemostasis achieved with: pressure, aluminum chloride and electrodesiccation   Outcome: patient tolerated procedure well   Post-procedure details: sterile dressing applied and wound care instructions given   Dressing type: bandage and petrolatum    Specimen 2 - Surgical pathology Differential Diagnosis: nevus r/o dysplasia   Check Margins:yes  right low back 5.5 cm lateral to spine inferior - Epidermal / dermal shaving  Lesion diameter (cm):  0.4 Informed consent: discussed and consent obtained   Timeout: patient name, date of birth, surgical site, and procedure verified   Procedure prep:  Patient was prepped and draped in usual sterile fashion Prep type:  Isopropyl alcohol Anesthesia: the lesion was anesthetized in a standard fashion   Anesthetic:  1% lidocaine w/ epinephrine 1-100,000 buffered w/ 8.4% NaHCO3 Instrument used: flexible razor blade   Hemostasis achieved with: pressure, aluminum chloride and electrodesiccation   Outcome: patient tolerated procedure well   Post-procedure details: sterile dressing applied and wound care instructions given   Dressing type: bandage and petrolatum    Specimen 3 - Surgical pathology Differential Diagnosis: nevus r/o dysplasia   Check Margins: yes  R/o bcc and ED&C at upper back right paraspinal  R/o dysplastic nevus  ACTINIC SKIN DAMAGE   LENTIGO   MELANOCYTIC NEVUS, UNSPECIFIED LOCATION   SEBORRHEIC KERATOSIS   HISTORY OF BASAL CELL CARCINOMA   COUNSELING AND COORDINATION OF CARE   MEDICATION MANAGEMENT    Return for 6 to 1 year tbse .  IEleanor Blush, CMA, am acting as scribe for Alm Rhyme, MD.   Documentation: I have reviewed the above documentation for accuracy and completeness, and I agree with the above.  Alm Rhyme, MD    "

## 2024-06-09 NOTE — Patient Instructions (Addendum)
 Beverley Leeroy Free, M.D. [address] [place], [place] [address] [phone]  [email or web address]  Or  [place] MATSU. Adigum MD  [id number],  [place], [place] [address] Phone: [phone] Fax: [phone] Office Hours: Monday - Friday 8:30am to 4:30pm   Biopsy Wound Care Instructions  Leave the original bandage on for 24 hours if possible.  If the bandage becomes soaked or soiled before that time, it is OK to remove it and examine the wound.  A small amount of post-operative bleeding is normal.  If excessive bleeding occurs, remove the bandage, place gauze over the site and apply continuous pressure (no peeking) over the area for 30 minutes. If this does not work, please call our clinic as soon as possible or page your doctor if it is after hours.   Once a day, cleanse the wound with soap and water. It is fine to shower. If a thick crust develops you may use a Q-tip dipped into dilute hydrogen peroxide (mix 1:1 with water) to dissolve it.  Hydrogen peroxide can slow the healing process, so use it only as needed.    After washing, apply petroleum jelly (Vaseline) or an antibiotic ointment if your doctor prescribed one for you, followed by a bandage.    For best healing, the wound should be covered with a layer of ointment at all times. If you are not able to keep the area covered with a bandage to hold the ointment in place, this may mean re-applying the ointment several times a day.  Continue this wound care until the wound has healed and is no longer open.   Itching and mild discomfort is normal during the healing process. However, if you develop pain or severe itching, please call our office.   If you have any discomfort, you can take Tylenol (acetaminophen) or ibuprofen as directed on the bottle. (Please do not take these if you have an allergy  to them or cannot take them for another reason).  Some redness, tenderness and white or yellow material in the  wound is normal healing.  If the area becomes very sore and red, or develops a thick yellow-green material (pus), it may be infected; please notify us .    If you have stitches, return to clinic as directed to have the stitches removed. You will continue wound care for 2-3 days after the stitches are removed.   Wound healing continues for up to one year following surgery. It is not unusual to experience pain in the scar from time to time during the interval.  If the pain becomes severe or the scar thickens, you should notify the office.    A slight amount of redness in a scar is expected for the first six months.  After six months, the redness will fade and the scar will soften and fade.  The color difference becomes less noticeable with time.  If there are any problems, return for a post-op surgery check at your earliest convenience.  To improve the appearance of the scar, you can use silicone scar gel, cream, or sheets (such as Mederma or Serica) every night for up to one year. These are available over the counter (without a prescription).  Please call our office at [phone] for any questions or concerns.        Seborrheic Keratosis  What causes seborrheic keratoses? Seborrheic keratoses are harmless, common skin growths that first appear during adult life.  As time goes by, more growths  appear.  Some people may develop a large number of them.  Seborrheic keratoses appear on both covered and uncovered body parts.  They are not caused by sunlight.  The tendency to develop seborrheic keratoses can be inherited.  They vary in color from skin-colored to gray, brown, or even black.  They can be either smooth or have a rough, warty surface.   Seborrheic keratoses are superficial and look as if they were stuck on the skin.  Under the microscope this type of keratosis looks like layers upon layers of skin.  That is why at times the top layer may seem to fall off, but the rest of the growth  remains and re-grows.    Treatment Seborrheic keratoses do not need to be treated, but can easily be removed in the office.  Seborrheic keratoses often cause symptoms when they rub on clothing or jewelry.  Lesions can be in the way of shaving.  If they become inflamed, they can cause itching, soreness, or burning.  Removal of a seborrheic keratosis can be accomplished by freezing, burning, or surgery. If any spot bleeds, scabs, or grows rapidly, please return to have it checked, as these can be an indication of a skin cancer.   Cryotherapy Aftercare  Wash gently with soap and water everyday.   Apply Vaseline and Band-Aid daily until healed.      Due to recent changes in healthcare laws, you may see results of your pathology and/or laboratory studies on MyChart before the doctors have had a chance to review them. We understand that in some cases there may be results that are confusing or concerning to you. Please understand that not all results are received at the same time and often the doctors may need to interpret multiple results in order to provide you with the best plan of care or course of treatment. Therefore, we ask that you please give us  2 business days to thoroughly review all your results before contacting the office for clarification. Should we see a critical lab result, you will be contacted sooner.   If You Need Anything After Your Visit  If you have any questions or concerns for your doctor, please call our main line at [phone] and press option 4 to reach your doctor's medical assistant. If no one answers, please leave a voicemail as directed and we will return your call as soon as possible. Messages left after 4 pm will be answered the following business day.   You may also send us  a message via MyChart. We typically respond to MyChart messages within 1-2 business days.  For prescription refills, please ask your pharmacy to contact our office. Our fax number is  [phone].  If you have an urgent issue when the clinic is closed that cannot wait until the next business day, you can page your doctor at the number below.    Please note that while we do our best to be available for urgent issues outside of office hours, we are not available 24/7.   If you have an urgent issue and are unable to reach us , you may choose to seek medical care at your doctor's office, retail clinic, urgent care center, or emergency room.  If you have a medical emergency, please immediately call 911 or go to the emergency department.  Pager Numbers  - Dr. Hester: [phone]  - Dr. Jackquline: [phone]  - Dr. Claudene: [phone]   - Dr. Raymund: [phone]  In the event of inclement weather, please  call our main line at [phone] for an update on the status of any delays or closures.  Dermatology Medication Tips: Please keep the boxes that topical medications come in in order to help keep track of the instructions about where and how to use these. Pharmacies typically print the medication instructions only on the boxes and not directly on the medication tubes.   If your medication is too expensive, please contact our office at [phone] option 4 or send us  a message through MyChart.   We are unable to tell what your co-pay for medications will be in advance as this is different depending on your insurance coverage. However, we may be able to find a substitute medication at lower cost or fill out paperwork to get insurance to cover a needed medication.   If a prior authorization is required to get your medication covered by your insurance company, please allow us  1-2 business days to complete this process.  Drug prices often vary depending on where the prescription is filled and some pharmacies may offer cheaper prices.  The website www.goodrx.com contains coupons for medications through different pharmacies. The prices here do not account for what the cost  may be with help from insurance (it may be cheaper with your insurance), but the website can give you the price if you did not use any insurance.  - You can print the associated coupon and take it with your prescription to the pharmacy.  - You may also stop by our office during regular business hours and pick up a GoodRx coupon card.  - If you need your prescription sent electronically to a different pharmacy, notify our office through [facility] or by phone at [phone] option 4.     Si Usted Necesita Algo Despus de Su Visita  Tambin puede enviarnos un mensaje a travs de Clinical Cytogeneticist. Por lo general respondemos a los mensajes de MyChart en el transcurso de 1 a 2 das hbiles.  Para renovar recetas, por favor pida a su farmacia que se ponga en contacto con nuestra oficina. Randi lakes de fax es [place] [phone].  Si tiene un asunto urgente cuando la clnica est cerrada y que no puede esperar hasta el siguiente da hbil, puede llamar/localizar a su doctor(a) al nmero que aparece a continuacin.   Por favor, tenga en cuenta que aunque hacemos todo lo posible para estar disponibles para asuntos urgentes fuera del horario de [place], no estamos disponibles las 24 horas del da, los 7 [address]  [address] de la [place].   Si tiene un problema urgente y no puede comunicarse con nosotros, puede optar por buscar atencin mdica  en el consultorio de su doctor(a), en una clnica privada, en un centro de atencin urgente o en una sala de emergencias.  Si tiene engineer, drilling, por favor llame inmediatamente al 911 o vaya a la sala de emergencias.  Nmeros de bper  - Dr. Hester: [phone]  - Dra. Jackquline: [phone]  - Dr. Claudene: [phone]  - Dra. Kitts: [phone]  En caso de inclemencias del [place], por favor llame a nuestra lnea principal al [phone] para una actualizacin sobre el estado de cualquier retraso o cierre.  Consejos para la medicacin en dermatologa: Por  favor, guarde las cajas en las que vienen los medicamentos de uso tpico para ayudarle a seguir las instrucciones sobre dnde y cmo usarlos. Las farmacias generalmente imprimen las instrucciones del medicamento slo en las cajas y no directamente en los tubos del [place].   Si su  medicamento es [place], por favor, pngase en contacto con landry rieger llamando al [phone] y presione la opcin 4 o envenos un mensaje a travs de Clinical Cytogeneticist.   No podemos decirle cul ser su copago por los medicamentos por adelantado ya que esto es diferente dependiendo de la cobertura de su seguro. Sin embargo, es posible que podamos encontrar un medicamento sustituto a audiological scientist un formulario para que el seguro cubra el medicamento que se considera necesario.   Si se requiere una autorizacin previa para que su compaa de seguros cubra su medicamento, por favor permtanos de 1 a 2 das hbiles para completar este proceso.  Los precios de los medicamentos varan con frecuencia dependiendo del environmental consultant de dnde se surte la receta y alguna farmacias pueden ofrecer precios ms baratos.  El sitio web www.goodrx.com tiene cupones para medicamentos de health and safety inspector. Los precios aqu no tienen en cuenta lo que podra costar con la ayuda del seguro (puede ser ms barato con su seguro), pero el sitio web puede darle el precio si no utiliz tourist information centre manager.  - Puede imprimir el cupn correspondiente y llevarlo con su receta a la farmacia.  - Tambin puede pasar por nuestra oficina durante el horario de atencin regular y education officer, museum una tarjeta de cupones de GoodRx.  - Si necesita que su receta se enve electrnicamente a una farmacia diferente, informe a nuestra oficina a travs de MyChart de [place] o por telfono llamando al [phone] y presione la opcin 4.

## 2024-06-11 LAB — SURGICAL PATHOLOGY

## 2024-06-15 ENCOUNTER — Ambulatory Visit: Payer: Self-pay | Admitting: Dermatology

## 2024-09-15 ENCOUNTER — Ambulatory Visit: Admitting: Dermatology

## 2024-12-09 ENCOUNTER — Ambulatory Visit: Admitting: Dermatology

## 2025-03-09 ENCOUNTER — Ambulatory Visit: Admitting: Dermatology
